# Patient Record
Sex: Female | Born: 2000 | Race: White | Hispanic: No | Marital: Single | Smoking: Never smoker
Health system: Southern US, Community
[De-identification: ages and names within clinical notes are randomized; demographics above are authoritative.]

## PROBLEM LIST (undated history)

## (undated) DIAGNOSIS — R011 Cardiac murmur, unspecified: Secondary | ICD-10-CM

## (undated) HISTORY — DX: Cardiac murmur, unspecified: R01.1

---
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
## 2018-03-06 ENCOUNTER — Telehealth: Payer: Self-pay | Admitting: Family Medicine

## 2018-03-06 NOTE — Telephone Encounter (Signed)
Patient was immune in 2018. Should she have the MMR?

## 2018-03-06 NOTE — Telephone Encounter (Signed)
See note

## 2018-03-06 NOTE — Telephone Encounter (Deleted)
Message is also in daughters chart.

## 2018-03-06 NOTE — Telephone Encounter (Signed)
No. Should be fine.

## 2018-03-06 NOTE — Telephone Encounter (Signed)
Copied from [place] [id number]. Topic: General - Other >> Mar 06, 2018  8:27 AM Lennox Solders wrote: Reason for CRM: pt mom ann is calling her daughter is at [employer] and they have mumps outbreak the school is offering vaccine.  Pt mom would like to know if her daughter should have a titer or just come to office for another vaccine. Mom would like to know what does CDC recommends

## 2018-03-06 NOTE — Telephone Encounter (Signed)
Left message for patients mother that per Dr. Juleen China patient does not need titer or injection.

## 2018-04-30 ENCOUNTER — Encounter: Payer: Self-pay | Admitting: Physician Assistant

## 2018-04-30 ENCOUNTER — Ambulatory Visit (INDEPENDENT_AMBULATORY_CARE_PROVIDER_SITE_OTHER): Payer: 59 | Admitting: Physician Assistant

## 2018-04-30 VITALS — BP 104/70 | HR 84 | Temp 99.0°F | Ht 62.01 in | Wt 108.8 lb

## 2018-04-30 DIAGNOSIS — R52 Pain, unspecified: Secondary | ICD-10-CM

## 2018-04-30 LAB — POCT INFLUENZA A/B
INFLUENZA A, POC: NEGATIVE
Influenza B, POC: NEGATIVE

## 2018-04-30 NOTE — Progress Notes (Signed)
Katie Estrada is a 17 y.o. female here for a new problem.  History of Present Illness:   Chief Complaint  Patient presents with  . Cough  . Generalized Body Aches  . Nasal Congestion    HPI   Over the weekend patient developed nasal congestion, generalized body aches, and dry cough.  She has not had any fever.  She did receive a flu shot this year.  She is eating and drinking well.  She currently lives at [facility] and has been around several people who are sick.  She is taking ibuprofen and this is improving her symptoms for the most part.  She denies chest pain, shortness of breath, neck stiffness, swollen jaws.  She had some sore throat at first but it is essentially resolved.  Past Medical History:  Diagnosis Date  . Heart murmur      Social History   Socioeconomic History  . Marital status: Single    Spouse name: Not on file  . Number of children: Not on file  . Years of education: Not on file  . Highest education level: Not on file  Occupational History  . Not on file  Social Needs  . Financial resource strain: Not on file  . Food insecurity:    Worry: Not on file    Inability: Not on file  . Transportation needs:    Medical: Not on file    Non-medical: Not on file  Tobacco Use  . Smoking status: Never Smoker  . Smokeless tobacco: Never Used  Substance and Sexual Activity  . Alcohol use: No  . Drug use: No  . Sexual activity: Not Currently  Lifestyle  . Physical activity:    Days per week: Not on file    Minutes per session: Not on file  . Stress: Not on file  Relationships  . Social connections:    Talks on phone: Not on file    Gets together: Not on file    Attends religious service: Not on file    Active member of club or organization: Not on file    Attends meetings of clubs or organizations: Not on file    Relationship status: Not on file  . Intimate partner violence:    Fear of current or ex partner: Not on file    Emotionally  abused: Not on file    Physically abused: Not on file    Forced sexual activity: Not on file  Other Topics Concern  . Not on file  Social History Narrative  . Not on file    History reviewed. No pertinent surgical history.  History reviewed. No pertinent family history.  No Known Allergies  Current Medications:   Current Outpatient Medications:  .  JUNEL 1/20 1-20 MG-MCG tablet, TAKE 1 TABLET BY MOUTH EVERY DAY, Disp: 84 tablet, Rfl: 0 .  magic mouthwash w/lidocaine SOLN, One part Benadryl, Maalox, Nystatin mixed with 1/2 lidocaine., Disp: 360 mL, Rfl: 1 .  Multiple Vitamins-Minerals (MULTI-VITAMIN GUMMIES PO), Take 1 each by mouth daily., Disp: , Rfl:  .  norethindrone-ethinyl estradiol (JUNEL 1/20) 1-20 MG-MCG tablet, TAKE 1 TABLET BY MOUTH EVERY DAY, Disp: , Rfl:    Review of Systems:   ROS Negative unless otherwise specified per HPI.  Vitals:   Vitals:   04/30/18 1617  BP: 104/70  Pulse: 84  Temp: 99 F (37.2 C)  TempSrc: Oral  SpO2: 99%  Weight: 108 lb 12.8 oz (49.4 kg)  Height: 5' 2.01" (1.575  m)     Body mass index is 19.89 kg/m.  Physical Exam:   Physical Exam  Constitutional: She appears well-developed. She is cooperative.  Non-toxic appearance. She does not have a sickly appearance. She does not appear ill. No distress.  HENT:  Head: Normocephalic and atraumatic.  Right Ear: Tympanic membrane, external ear and ear canal normal. Tympanic membrane is not erythematous, not retracted and not bulging.  Left Ear: Tympanic membrane, external ear and ear canal normal. Tympanic membrane is not erythematous, not retracted and not bulging.  Nose: Mucosal edema and rhinorrhea present. Right sinus exhibits no maxillary sinus tenderness and no frontal sinus tenderness. Left sinus exhibits no maxillary sinus tenderness and no frontal sinus tenderness.  Mouth/Throat: Uvula is midline. Posterior oropharyngeal erythema present. No posterior oropharyngeal edema. Tonsils  are 0 on the right. Tonsils are 0 on the left.  Eyes: Conjunctivae and lids are normal.  Neck: Trachea normal.  Cardiovascular: Normal rate, regular rhythm, S1 normal, S2 normal and normal heart sounds.  Pulmonary/Chest: Effort normal and breath sounds normal. She has no decreased breath sounds. She has no wheezes. She has no rhonchi. She has no rales.  Lymphadenopathy:    She has no cervical adenopathy.  Neurological: She is alert.  Skin: Skin is warm, dry and intact.  Psychiatric: She has a normal mood and affect. Her speech is normal and behavior is normal.  Nursing note and vitals reviewed.   Results for orders placed or performed in visit on 04/30/18  POCT Influenza A/B  Result Value Ref Range   Influenza A, POC Negative Negative   Influenza B, POC Negative Negative    Assessment and Plan:   Katie Estrada was seen today for cough, generalized body aches and nasal congestion.  Diagnoses and all orders for this visit:  Body aches -     POCT Influenza A/B   I suspect she has a viral illness.  Flu test was negative.  Discussed supportive care including ibuprofen.  Return precautions discussed and advised.  Patient and father verbalized understanding to plan.  . Reviewed expectations re: course of current medical issues. . Discussed self-management of symptoms. . Outlined signs and symptoms indicating need for more acute intervention. . Patient verbalized understanding and all questions were answered. . See orders for this visit as documented in the electronic medical record. . Patient received an After-Visit Summary.    Inda Coke, PA-C

## 2018-04-30 NOTE — Patient Instructions (Signed)
It was great to see you!  You have a viral upper respiratory infection. Antibiotics are not needed for this.  Viral infections usually take 7-10 days to resolve.  The cough can last a few weeks to go away.  Ibuprofen as needed.  Push fluids and get plenty of rest. Please return if you are not improving as expected, or if you have high fevers (>101.5) or difficulty swallowing or worsening productive cough.  Call clinic with questions.  I hope you start feeling better soon!

## 2018-05-01 ENCOUNTER — Encounter: Payer: Self-pay | Admitting: Physician Assistant

## 2018-05-18 ENCOUNTER — Other Ambulatory Visit: Payer: Self-pay | Admitting: Family Medicine

## 2018-05-18 DIAGNOSIS — N926 Irregular menstruation, unspecified: Secondary | ICD-10-CM

## 2018-06-05 ENCOUNTER — Ambulatory Visit (INDEPENDENT_AMBULATORY_CARE_PROVIDER_SITE_OTHER): Payer: 59 | Admitting: Physician Assistant

## 2018-06-05 ENCOUNTER — Encounter: Payer: Self-pay | Admitting: Physician Assistant

## 2018-06-05 VITALS — BP 100/68 | HR 95 | Temp 98.4°F | Ht 62.0 in | Wt 107.4 lb

## 2018-06-05 DIAGNOSIS — K1370 Unspecified lesions of oral mucosa: Secondary | ICD-10-CM | POA: Diagnosis not present

## 2018-06-05 LAB — CBC WITH DIFFERENTIAL/PLATELET
Basophils Absolute: 0 10*3/uL (ref 0.0–0.1)
Basophils Relative: 1 % (ref 0.0–3.0)
Eosinophils Absolute: 0 10*3/uL (ref 0.0–0.7)
Eosinophils Relative: 1 % (ref 0.0–5.0)
HCT: 37.2 % (ref 36.0–49.0)
Hemoglobin: 12.4 g/dL (ref 12.0–16.0)
LYMPHS ABS: 1.4 10*3/uL (ref 0.7–4.0)
Lymphocytes Relative: 28.7 % (ref 24.0–48.0)
MCHC: 33.4 g/dL (ref 31.0–37.0)
MCV: 79.7 fl (ref 78.0–98.0)
Monocytes Absolute: 0.2 10*3/uL (ref 0.1–1.0)
Monocytes Relative: 5 % (ref 3.0–12.0)
Neutro Abs: 3.1 10*3/uL (ref 1.4–7.7)
Neutrophils Relative %: 64.3 % (ref 43.0–71.0)
Platelets: 361 10*3/uL (ref 150.0–575.0)
RBC: 4.66 Mil/uL (ref 3.80–5.70)
RDW: 12.7 % (ref 11.4–15.5)
WBC: 4.8 10*3/uL (ref 4.5–13.5)

## 2018-06-05 LAB — C-REACTIVE PROTEIN: CRP: 0.6 mg/dL (ref 0.5–20.0)

## 2018-06-05 LAB — VITAMIN B12: Vitamin B-12: 190 pg/mL — ABNORMAL LOW (ref 211–911)

## 2018-06-05 LAB — SEDIMENTATION RATE: Sed Rate: 20 mm/hr (ref 0–20)

## 2018-06-05 MED ORDER — MAGIC MOUTHWASH: 5.0000 mL | Freq: Three times a day (TID) | ORAL | 0 refills | Status: DC | PRN

## 2018-06-05 NOTE — Patient Instructions (Addendum)
It was great to see you!  We will contact you with your lab results.  Use mouthwash as prescribed. Please follow-up as needed.  Take care,  Inda Coke PA-C

## 2018-06-05 NOTE — Progress Notes (Signed)
Katie Estrada is a 18 y.o. female here for a new problem.  I acted as a Education administrator for [employer], PA-C Katie Pickler, LPN  History of Present Illness:   Chief Complaint  Patient presents with  . Mouth Lesions    Mouth Lesions   The current episode started more than 1 week ago (Pt c/o 2- 3 mouth lesion now, did have 4-5 to start.). The onset was sudden. The problem occurs frequently. The problem has been gradually worsening. The problem is severe. Nothing (Pt has taken Ibuprofen, Magic Mouthwash, Chorahexidine) relieves the symptoms. The symptoms are aggravated by eating and drinking. Associated symptoms include mouth sores and sore throat (due to mouth lesion). Pertinent negatives include no fever, no eye itching, no abdominal pain, no constipation, no diarrhea, no nausea, no vomiting, no congestion, no ear discharge, no ear pain, no headaches, no swollen glands, no muscle aches, no neck pain, no neck stiffness, no rash, no diaper rash, no eye discharge and no eye pain. There were no sick contacts.   She has had multiple episodes of canker sores throughout her life.  She went to the dentist yesterday and was told it was not related to her teeth/gums.   Denies: fatigue, unusual rashes, joint pain, weight changes, fevers  Past Medical History:  Diagnosis Date  . Heart murmur      Social History   Socioeconomic History  . Marital status: Single    Spouse name: Not on file  . Number of children: Not on file  . Years of education: Not on file  . Highest education level: Not on file  Occupational History  . Not on file  Social Needs  . Financial resource strain: Not on file  . Food insecurity:    Worry: Not on file    Inability: Not on file  . Transportation needs:    Medical: Not on file    Non-medical: Not on file  Tobacco Use  . Smoking status: Never Smoker  . Smokeless tobacco: Never Used  Substance and Sexual Activity  . Alcohol use: No  . Drug use: No  . Sexual  activity: Not Currently  Lifestyle  . Physical activity:    Days per week: Not on file    Minutes per session: Not on file  . Stress: Not on file  Relationships  . Social connections:    Talks on phone: Not on file    Gets together: Not on file    Attends religious service: Not on file    Active member of club or organization: Not on file    Attends meetings of clubs or organizations: Not on file    Relationship status: Not on file  . Intimate partner violence:    Fear of current or ex partner: Not on file    Emotionally abused: Not on file    Physically abused: Not on file    Forced sexual activity: Not on file  Other Topics Concern  . Not on file  Social History Narrative  . Not on file    History reviewed. No pertinent surgical history.  History reviewed. No pertinent family history.  No Known Allergies  Current Medications:   Current Outpatient Medications:  .  JUNEL 1/20 1-20 MG-MCG tablet, TAKE 1 TABLET BY MOUTH EVERY DAY, Disp: 84 tablet, Rfl: 3 .  magic mouthwash w/lidocaine SOLN, One part Benadryl, Maalox, Nystatin mixed with 1/2 lidocaine., Disp: 360 mL, Rfl: 1 .  Multiple Vitamins-Minerals (MULTI-VITAMIN GUMMIES PO), Take 1  each by mouth daily., Disp: , Rfl:  .  norethindrone-ethinyl estradiol (JUNEL 1/20) 1-20 MG-MCG tablet, TAKE 1 TABLET BY MOUTH EVERY DAY, Disp: , Rfl:  .  magic mouthwash SOLN, Take 5 mLs by mouth 3 (three) times daily as needed for mouth pain., Disp: 240 mL, Rfl: 0   Review of Systems:   Review of Systems  Constitutional: Negative for fever.  HENT: Positive for mouth sores and sore throat (due to mouth lesion). Negative for congestion, ear discharge and ear pain.   Eyes: Negative for pain, discharge and itching.  Gastrointestinal: Negative for abdominal pain, constipation, diarrhea, nausea and vomiting.  Musculoskeletal: Negative for neck pain.  Skin: Negative for rash.  Neurological: Negative for headaches.    Vitals:   Vitals:    06/05/18 1129  BP: 100/68  Pulse: 95  Temp: 98.4 F (36.9 C)  TempSrc: Oral  SpO2: 96%  Weight: 107 lb 6.1 oz (48.7 kg)  Height: 5\' 2"  (1.575 m)     Body mass index is 19.64 kg/m.  Physical Exam:   Physical Exam Vitals signs and nursing note reviewed.  Constitutional:      General: She is not in acute distress.    Appearance: She is well-developed. She is not ill-appearing or toxic-appearing.  HENT:     Mouth/Throat:     Mouth: Oral lesions (two erythematous circular lesions to inside of bottom lower lip; no drainage noted) present.     Dentition: Normal dentition. No gingival swelling or gum lesions.  Cardiovascular:     Rate and Rhythm: Normal rate and regular rhythm.     Pulses: Normal pulses.     Heart sounds: Normal heart sounds, S1 normal and S2 normal.     Comments: No LE edema Pulmonary:     Effort: Pulmonary effort is normal.     Breath sounds: Normal breath sounds.  Skin:    General: Skin is warm and dry.  Neurological:     Mental Status: She is alert.     GCS: GCS eye subscore is 4. GCS verbal subscore is 5. GCS motor subscore is 6.  Psychiatric:        Speech: Speech normal.        Behavior: Behavior normal. Behavior is cooperative.      Assessment and Plan:   Katie Estrada was seen today for mouth lesions.  Diagnoses and all orders for this visit:  Mouth lesion No red flags on exam or determined during history. Patient requesting work-up for recurrent ulcers. Discussed labs listed below. I have also prescribed the mouthwash that she has requested, with Hydrocortisone 60 mg + Nystatin 30 ml + qs Benadryl = 240 ml. Further intervention based on lab results. -     RPR -     HIV Antibody (routine testing w rflx) -     CBC with Differential/Platelet -     Sedimentation rate -     C-reactive protein -     ANA -     Vitamin B12  Other orders -     magic mouthwash SOLN; Take 5 mLs by mouth 3 (three) times daily as needed for mouth pain.    . Reviewed  expectations re: course of current medical issues. . Discussed self-management of symptoms. . Outlined signs and symptoms indicating need for more acute intervention. . Patient verbalized understanding and all questions were answered. . See orders for this visit as documented in the electronic medical record. . Patient received an After-Visit Summary.  CMA or LPN served as scribe during this visit. History, Physical, and Plan performed by medical provider. The above documentation has been reviewed and is accurate and complete.   Inda Coke, PA-C

## 2018-06-07 LAB — RPR: RPR Ser Ql: NONREACTIVE

## 2018-06-07 LAB — HIV ANTIBODY (ROUTINE TESTING W REFLEX): HIV 1&2 Ab, 4th Generation: NONREACTIVE

## 2018-06-07 LAB — ANA: Anti Nuclear Antibody(ANA): NEGATIVE

## 2018-06-10 ENCOUNTER — Telehealth: Payer: Self-pay | Admitting: Family Medicine

## 2018-06-10 ENCOUNTER — Other Ambulatory Visit: Payer: Self-pay

## 2018-06-10 MED ORDER — "SYRINGE 25G X 1"" 3 ML MISC": 1.0000 "application " | 0 refills | Status: DC

## 2018-06-10 MED ORDER — CYANOCOBALAMIN 1000 MCG/ML IJ SOLN: INTRAMUSCULAR | 0 refills | Status: DC

## 2018-06-10 NOTE — Telephone Encounter (Signed)
See note  Copied from [place] [id number]. Topic: General - Other >> Jun 10, 2018  9:18 AM Katie Estrada wrote: Reason for CRM: Patients mom Katie Estrada called to ask can the Rx for B-12 be sent to the Pharmacy so that she can give daughter the shot at home. She states that patient has Estrada very hectic school schedule and its hard to come up for this injection. Ask for instructions be sent to the pharmacy.  Mom is Estrada Marine scientist at [employer]. Ph# [phone]

## 2018-06-13 NOTE — Telephone Encounter (Signed)
Called in scripts l/m to let mom know.

## 2018-06-17 ENCOUNTER — Other Ambulatory Visit: Payer: Self-pay | Admitting: *Deleted

## 2018-06-17 MED ORDER — MAGIC MOUTHWASH: 5.0000 mL | Freq: Three times a day (TID) | ORAL | 0 refills | Status: DC | PRN

## 2018-06-17 NOTE — Telephone Encounter (Signed)
Rx for Magic Mouthwash faxed to [facility].

## 2018-06-27 ENCOUNTER — Telehealth: Payer: Self-pay | Admitting: Family Medicine

## 2018-06-27 NOTE — Telephone Encounter (Signed)
See note

## 2018-06-27 NOTE — Telephone Encounter (Signed)
Is this something you can help with or do I need to send to [facility]?

## 2018-06-27 NOTE — Telephone Encounter (Signed)
Since they have significantly worsened, I think she needs to be re-evaluated in the office so we can do a good exam and see what's going on.

## 2018-06-27 NOTE — Telephone Encounter (Signed)
App made for office in the morning

## 2018-06-27 NOTE — Telephone Encounter (Signed)
Copied from [place] [id number]. Topic: Quick Communication - Rx Refill/Question >> Jun 27, 2018  8:12 AM Scherrie Gerlach wrote: Medication: something for sores in her mouth  Mom calling to ask if there is anything Dr Juleen China can do in addition to the B12 inj that can help with the sores in her mouth. But they seem to be getting worse instead of better. Mom describes as "raging sores".  Pt unable to eat due to the sores and losing weight. This has been going on for years.  Pt saw the dentist, who said see her doctor.  Pt saw Aldona Bar 1/08 for this same issues. Mom wanting to see if you can do anti viral now as you had mentioned.  Please advise [id number] [address], [place] [phone] (Phone) [phone] (Fax)

## 2018-06-27 NOTE — Progress Notes (Signed)
Katie Estrada is a 18 y.o. female here for a follow up of a pre-existing problem.  I acted as a Education administrator for [employer], PA-C Anselmo Pickler, LPN  History of Present Illness:   Chief Complaint  Patient presents with  . Mouth Lesions    Mouth Lesions   Episode onset: Pt here for recurrent mouth lesions, has gotten worse. The onset was sudden. The problem occurs continuously. The problem has been gradually worsening. The problem is moderate. The symptoms are relieved by one or more prescription drugs. The symptoms are aggravated by eating and drinking. Associated symptoms include mouth sores. Pertinent negatives include no fever and no cough.   She has tried: lysine, magic mouthwash with lidocaine and magic mouthwash with hydrocortisone. She is continuing weekly B-12 injections, as it was discovered at her last visit that her B12 was 190.  Denies vaginal ulcers.   Past Medical History:  Diagnosis Date  . Heart murmur      Social History   Socioeconomic History  . Marital status: Single    Spouse name: Not on file  . Number of children: Not on file  . Years of education: Not on file  . Highest education level: Not on file  Occupational History  . Not on file  Social Needs  . Financial resource strain: Not on file  . Food insecurity:    Worry: Not on file    Inability: Not on file  . Transportation needs:    Medical: Not on file    Non-medical: Not on file  Tobacco Use  . Smoking status: Never Smoker  . Smokeless tobacco: Never Used  Substance and Sexual Activity  . Alcohol use: No  . Drug use: No  . Sexual activity: Not Currently  Lifestyle  . Physical activity:    Days per week: Not on file    Minutes per session: Not on file  . Stress: Not on file  Relationships  . Social connections:    Talks on phone: Not on file    Gets together: Not on file    Attends religious service: Not on file    Active member of club or organization: Not on file    Attends  meetings of clubs or organizations: Not on file    Relationship status: Not on file  . Intimate partner violence:    Fear of current or ex partner: Not on file    Emotionally abused: Not on file    Physically abused: Not on file    Forced sexual activity: Not on file  Other Topics Concern  . Not on file  Social History Narrative  . Not on file    History reviewed. No pertinent surgical history.  History reviewed. No pertinent family history.  No Known Allergies  Current Medications:   Current Outpatient Medications:  .  cyanocobalamin (,VITAMIN B-12,) 1000 MCG/ML injection, 1000 mcg injection once per month., Disp: 6 mL, Rfl: 0 .  JUNEL 1/20 1-20 MG-MCG tablet, TAKE 1 TABLET BY MOUTH EVERY DAY, Disp: 84 tablet, Rfl: 3 .  Lysine 500 MG TABS, Take 2 tablets by mouth daily. , Disp: , Rfl:  .  magic mouthwash w/lidocaine SOLN, One part Benadryl, Maalox, Nystatin mixed with 1/2 lidocaine., Disp: 360 mL, Rfl: 1 .  Multiple Vitamins-Minerals (MULTI-VITAMIN GUMMIES PO), Take 1 each by mouth daily., Disp: , Rfl:  .  Probiotic Product (PROBIOTIC DAILY PO), Take 1 tablet by mouth daily., Disp: , Rfl:  .  Syringe/Needle, Disp, (SYRINGE  3CC/25GX1") 25G X 1" 3 ML MISC, 1 application by Does not apply route once a week., Disp: 12 each, Rfl: 0 .  predniSONE (DELTASONE) 20 MG tablet, Take 1 tablet (20 mg total) by mouth daily with breakfast., Disp: 5 tablet, Rfl: 0 .  triamcinolone (KENALOG) 0.1 % paste, Use as directed 1 application in the mouth or throat 2 (two) times daily., Disp: 5 g, Rfl: 12   Review of Systems:   Review of Systems  Constitutional: Negative for fever.  HENT: Positive for mouth sores.   Respiratory: Negative for cough.     Vitals:   Vitals:   06/28/18 0755  BP: 110/70  Pulse: 76  Temp: 98.7 F (37.1 C)  TempSrc: Oral  SpO2: 98%  Weight: 110 lb 4 oz (50 kg)  Height: 5\' 2"  (1.575 m)     Body mass index is 20.16 kg/m.  Physical Exam:   Physical Exam Vitals  signs and nursing note reviewed.  Constitutional:      General: She is not in acute distress.    Appearance: She is well-developed. She is not ill-appearing or toxic-appearing.  HENT:     Head: Normocephalic and atraumatic.     Right Ear: Tympanic membrane, ear canal and external ear normal. Tympanic membrane is not erythematous, retracted or bulging.     Left Ear: Tympanic membrane, ear canal and external ear normal. Tympanic membrane is not erythematous, retracted or bulging.     Nose: Nose normal.     Right Sinus: No maxillary sinus tenderness or frontal sinus tenderness.     Left Sinus: No maxillary sinus tenderness or frontal sinus tenderness.     Mouth/Throat:     Mouth: Oral lesions (two well-circumscribed erythematous areas in lower inner lip and on R inner lower cheeks) present.     Pharynx: Uvula midline. No posterior oropharyngeal erythema.  Eyes:     General: Lids are normal.     Conjunctiva/sclera: Conjunctivae normal.  Neck:     Trachea: Trachea normal.  Cardiovascular:     Rate and Rhythm: Normal rate and regular rhythm.     Heart sounds: Normal heart sounds, S1 normal and S2 normal.  Pulmonary:     Effort: Pulmonary effort is normal.     Breath sounds: Normal breath sounds. No decreased breath sounds, wheezing, rhonchi or rales.  Lymphadenopathy:     Cervical: No cervical adenopathy.  Skin:    General: Skin is warm and dry.  Neurological:     Mental Status: She is alert.  Psychiatric:        Speech: Speech normal.        Behavior: Behavior normal. Behavior is cooperative.      Assessment and Plan:   Tamaiya was seen today for mouth lesions.  Diagnoses and all orders for this visit:  Recurrent aphthous ulcer Uncontrolled. Continue Vit B12 injections. Triamcinolone paste rx provided. Recommended patient start with this and if no improvement, may trial 20 mg oral prednisone x 5 days. I have already sent this in for her. If persists, consider specialist  follow-up.  Other orders -     triamcinolone (KENALOG) 0.1 % paste; Use as directed 1 application in the mouth or throat 2 (two) times daily. -     predniSONE (DELTASONE) 20 MG tablet; Take 1 tablet (20 mg total) by mouth daily with breakfast.   . Reviewed expectations re: course of current medical issues. . Discussed self-management of symptoms. . Outlined signs and symptoms indicating need  for more acute intervention. . Patient verbalized understanding and all questions were answered. . See orders for this visit as documented in the electronic medical record. . Patient received an After-Visit Summary.  CMA or LPN served as scribe during this visit. History, Physical, and Plan performed by medical provider. The above documentation has been reviewed and is accurate and complete.   Inda Coke, PA-C

## 2018-06-28 ENCOUNTER — Encounter: Payer: Self-pay | Admitting: Physician Assistant

## 2018-06-28 ENCOUNTER — Ambulatory Visit (INDEPENDENT_AMBULATORY_CARE_PROVIDER_SITE_OTHER): Payer: 59 | Admitting: Physician Assistant

## 2018-06-28 ENCOUNTER — Ambulatory Visit: Payer: 59 | Admitting: Physician Assistant

## 2018-06-28 VITALS — BP 110/70 | HR 76 | Temp 98.7°F | Ht 62.0 in | Wt 110.2 lb

## 2018-06-28 DIAGNOSIS — K120 Recurrent oral aphthae: Secondary | ICD-10-CM | POA: Diagnosis not present

## 2018-06-28 DIAGNOSIS — E538 Deficiency of other specified B group vitamins: Secondary | ICD-10-CM | POA: Insufficient documentation

## 2018-06-28 MED ORDER — TRIAMCINOLONE ACETONIDE 0.1 % MT PSTE: 1.0000 "application " | PASTE | Freq: Two times a day (BID) | OROMUCOSAL | 12 refills | Status: DC

## 2018-06-28 MED ORDER — PREDNISONE 20 MG PO TABS: 20.0000 mg | ORAL_TABLET | Freq: Every day | ORAL | 0 refills | Status: DC

## 2018-06-28 NOTE — Assessment & Plan Note (Signed)
Uncontrolled. Continue Vit B12 injections. Triamcinolone paste rx provided. Recommended patient start with this and if no improvement, may trial 20 mg oral prednisone x 5 days. I have already sent this in for her. If persists, consider specialist follow-up.

## 2018-06-28 NOTE — Patient Instructions (Signed)
It was great to see you!  Start with the triamcinolone paste.  Also, studies have shown for recurrent aphthous ulcers, oral prednisone given at the dose of 20 to 40 mg per day for four to seven days will often provide relief from discomfort and accelerate healing of aphthous ulcers.   Take care,  Inda Coke PA-C

## 2018-07-04 ENCOUNTER — Encounter: Payer: Self-pay | Admitting: Family Medicine

## 2018-07-04 ENCOUNTER — Ambulatory Visit (INDEPENDENT_AMBULATORY_CARE_PROVIDER_SITE_OTHER): Payer: 59 | Admitting: Family Medicine

## 2018-07-04 VITALS — BP 98/66 | HR 101 | Temp 98.1°F | Ht 62.0 in | Wt 108.0 lb

## 2018-07-04 DIAGNOSIS — H6121 Impacted cerumen, right ear: Secondary | ICD-10-CM

## 2018-07-04 DIAGNOSIS — B349 Viral infection, unspecified: Secondary | ICD-10-CM | POA: Diagnosis not present

## 2018-07-04 MED ORDER — BENZONATATE 100 MG PO CAPS: 100.0000 mg | ORAL_CAPSULE | Freq: Three times a day (TID) | ORAL | 0 refills | Status: DC | PRN

## 2018-07-04 NOTE — Patient Instructions (Signed)
-  robitussin DM during day or your bromfed.  -1 tablespoon honey/day -cool mist humidifier at night -flonase at night -nyquil at night to help sleep -can try tessalon pearls. I sent those in.   Let us know if shortness of breath, fever or worsening symptoms. But symptoms may linger for 7-10 days.

## 2018-07-04 NOTE — Progress Notes (Signed)
Patient: Katie Estrada MRN: [id number] DOB: 2000-09-05 PCP: Briscoe Deutscher, DO     Subjective:  Chief Complaint  Patient presents with  . Cough  . Sore Throat    HPI: The patient is a 18 y.o. female who presents today for cough and sore throat. She tested positive for the flu on Monday. She did 3 doses of tamiflu and had nausea and headache so she stopped taking this. She states her cough is dry in nature. She has been taking bromfed and motrin and tylenol. She has not had fever since Tuesday. She thinks her suite mate had the flu, but she never said anything. No shortness of breath or wheezing. No hx of asthma and doesn't smoke. She doesn't really have bad congestion.  She has mild ear pain. Overall she has had improvement in her symptoms.    Review of Systems  Constitutional: Positive for fatigue. Negative for chills and fever.  HENT: Positive for congestion, ear pain, rhinorrhea and sore throat. Negative for postnasal drip, sinus pressure and sinus pain.        C/o minimal pressure in b/l ears  Eyes: Negative for photophobia and pain.  Respiratory: Positive for cough. Negative for shortness of breath.   Cardiovascular: Negative for chest pain.  Gastrointestinal: Positive for abdominal pain. Negative for diarrhea and nausea.  Musculoskeletal: Negative for back pain and neck pain.  Neurological: Positive for headaches. Negative for dizziness.  Psychiatric/Behavioral: Positive for sleep disturbance.    Allergies Patient has No Known Allergies.  Past Medical History Patient  has a past medical history of Heart murmur.  Surgical History Patient  has no past surgical history on file.  Family History Pateint's family history is not on file.  Social History Patient  reports that she has never smoked. She has never used smokeless tobacco. She reports that she does not drink alcohol or use drugs.    Objective: Vitals:   07/04/18 1342  BP: 98/66  Pulse: (!) 101  Temp: 98.1  F (36.7 C)  TempSrc: Oral  SpO2: 95%  Weight: 108 lb (49 kg)  Height: 5\' 2"  (1.575 m)    Body mass index is 19.75 kg/m.  Physical Exam Vitals signs reviewed.  Constitutional:      Appearance: She is well-developed.  HENT:     Head: Normocephalic and atraumatic.     Right Ear: Ear canal normal.     Left Ear: Tympanic membrane and ear canal normal.     Ears:     Comments: Right TM occluded by cerumen     Nose: Congestion present.     Comments: No TTP over sinuses Nasal turbinates wnl     Mouth/Throat:     Mouth: Mucous membranes are moist.     Pharynx: No oropharyngeal exudate or posterior oropharyngeal erythema.     Tonsils: No tonsillar exudate.  Eyes:     Conjunctiva/sclera: Conjunctivae normal.     Pupils: Pupils are equal, round, and reactive to light.  Neck:     Musculoskeletal: Normal range of motion and neck supple.  Cardiovascular:     Rate and Rhythm: Normal rate and regular rhythm.     Heart sounds: Normal heart sounds.  Pulmonary:     Effort: Pulmonary effort is normal. No respiratory distress.     Breath sounds: Normal breath sounds. No wheezing or rales.  Abdominal:     General: Bowel sounds are normal. There is no distension.     Palpations: Abdomen is soft.  Tenderness: There is no abdominal tenderness. There is no rebound.  Lymphadenopathy:     Cervical: No cervical adenopathy.  Skin:    General: Skin is warm.     Capillary Refill: Capillary refill takes less than 2 seconds.  Neurological:     General: No focal deficit present.     Mental Status: She is alert and oriented to person, place, and time.    Ceruminosis is noted.  Verbal consent given for lavage. Wax is removed by syringing and manual debridement. Instructions for home care to prevent wax buildup are given. Tm visualized and wnl after lavage.      Assessment/plan: 1. Viral illness Just diagnosed with the flu. Likely still part of flu illness. She is getting better, continues with  lingering cough. Continue with conservative therapy with rest, fluids, ibuprofen prn, robitussin DM, nyquil at night and honey. Also recommended flonase at night and cool mist humidifier. Will also send in tessalon pearls. Precautions given for any worsening symptoms e.g. shortness of breath, fever, etc. To return. Discussed flu symptoms, like all viral illness can normally last 5-10 days.     Return if symptoms worsen or fail to improve.  Orma Flaming, MD [place]   07/04/2018

## 2018-10-24 ENCOUNTER — Encounter: Payer: Self-pay | Admitting: Family Medicine

## 2018-11-25 ENCOUNTER — Telehealth: Payer: Self-pay | Admitting: Physical Therapy

## 2018-11-25 ENCOUNTER — Encounter: Payer: Self-pay | Admitting: Family Medicine

## 2018-11-25 NOTE — Telephone Encounter (Signed)
Copied from [id number] [id number]. Topic: Quick Communication - See Telephone Encounter >> Nov 25, 2018  4:41 PM Loma [place] wrote: CRM for notification. See Telephone encounter for: 11/25/18.Dr. Juleen China... in regards to my previous My Chart message.. I just found out that I also need my Immunization forms filled out along with a QuantiFERON lab test done. I believe I had my Hep B titer at your office previously. Is it okay if I come in soon for the lab and I will attach my forms. Please let me know if I need any more immunizations. 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 Thank you so much!  Attachments   Pt found out must have on July 1 and is very anxious 2nd call

## 2018-11-25 NOTE — Telephone Encounter (Signed)
Patient called and would like to know if her school form is ready for her to pickup since she needs its before July 1st. Please call patient back, thanks.

## 2018-11-26 ENCOUNTER — Other Ambulatory Visit (INDEPENDENT_AMBULATORY_CARE_PROVIDER_SITE_OTHER): Payer: 59

## 2018-11-26 ENCOUNTER — Other Ambulatory Visit: Payer: Self-pay

## 2018-11-26 DIAGNOSIS — Z111 Encounter for screening for respiratory tuberculosis: Secondary | ICD-10-CM

## 2018-11-26 DIAGNOSIS — Z1159 Encounter for screening for other viral diseases: Secondary | ICD-10-CM

## 2018-11-26 DIAGNOSIS — Z139 Encounter for screening, unspecified: Secondary | ICD-10-CM

## 2018-11-26 NOTE — Addendum Note (Signed)
Addended by: Francis Dowse T on: 11/26/2018 02:31 PM   Modules accepted: Orders

## 2018-11-26 NOTE — Telephone Encounter (Signed)
Called and spoke to patient.

## 2018-11-26 NOTE — Telephone Encounter (Signed)
ppw pending lab work

## 2018-11-27 ENCOUNTER — Encounter: Payer: Self-pay | Admitting: Family Medicine

## 2018-11-27 ENCOUNTER — Ambulatory Visit (INDEPENDENT_AMBULATORY_CARE_PROVIDER_SITE_OTHER): Payer: 59 | Admitting: Family Medicine

## 2018-11-27 ENCOUNTER — Other Ambulatory Visit: Payer: Self-pay

## 2018-11-27 VITALS — BP 98/62 | HR 96 | Temp 98.8°F | Ht 62.0 in | Wt 110.0 lb

## 2018-11-27 DIAGNOSIS — E538 Deficiency of other specified B group vitamins: Secondary | ICD-10-CM | POA: Diagnosis not present

## 2018-11-27 DIAGNOSIS — R5383 Other fatigue: Secondary | ICD-10-CM

## 2018-11-27 DIAGNOSIS — K120 Recurrent oral aphthae: Secondary | ICD-10-CM | POA: Diagnosis not present

## 2018-11-27 LAB — VITAMIN B12: Vitamin B-12: 236 pg/mL (ref 211–911)

## 2018-11-27 MED ORDER — CYANOCOBALAMIN 1000 MCG/ML IJ SOLN
1000.0000 ug | Freq: Once | INTRAMUSCULAR | Status: AC
  Administered 2018-11-27: 11:00:00 1000 ug via INTRAMUSCULAR

## 2018-11-27 NOTE — Progress Notes (Signed)
Katie Estrada is a 18 y.o. female is here for follow up.  History of Present Illness:   HPI:  Recent Dx of B12 deficiency. Took weekly injections x 4 weeks, then monthly x 2 months. Finished in April. Felt more energized until she finished.   Oral ulcers continue but go away more quickly with paste and using Biotene toothpaste.    There are no preventive care reminders to display for this patient.   Depression screen [facility] 2/9 11/27/2018 02/08/2017  Decreased Interest 0 0  Down, Depressed, Hopeless 0 0  PHQ - 2 Score 0 0   PMHx, SurgHx, SocialHx, FamHx, Medications, and Allergies were reviewed in the Visit Navigator and updated as appropriate.   Patient Active Problem List   Diagnosis Date Noted  . Recurrent aphthous ulcer 06/28/2018  . Vitamin B12 deficiency 06/28/2018  . Family history of ventricular fibrillation 06/08/2014   Social History   Tobacco Use  . Smoking status: Never Smoker  . Smokeless tobacco: Never Used  Substance Use Topics  . Alcohol use: No  . Drug use: No   Current Medications and Allergies   .  cyanocobalamin (,VITAMIN B-12,) 1000 MCG/ML injection, 1000 mcg injection once per month., Disp: 6 mL, Rfl: 0 .  JUNEL 1/20 1-20 MG-MCG tablet, TAKE 1 TABLET BY MOUTH EVERY DAY, Disp: 84 tablet, Rfl: 3 .  Lysine 500 MG TABS, Take 2 tablets by mouth daily. , Disp: , Rfl:  .  Probiotic Product (PROBIOTIC DAILY PO), Take 1 tablet by mouth daily., Disp: , Rfl:  .  Syringe/Needle, Disp, (SYRINGE 3CC/25GX1") 25G X 1" 3 ML MISC, 1 application by Does not apply route once a week., Disp: 12 each, Rfl: 0 .  triamcinolone (KENALOG) 0.1 % paste, Use as directed 1 application in the mouth or throat 2 (two) times daily., Disp: 5 g, Rfl: 12  No Known Allergies   Review of Systems   Pertinent items are noted in the HPI. Otherwise, a complete ROS is negative.  Vitals   Vitals:   11/27/18 1045  BP: 98/62  Pulse: 96  Temp: 98.8 F (37.1 C)  TempSrc: Oral  SpO2: 98%    Weight: 110 lb (49.9 kg)  Height: 5\' 2"  (1.575 m)     Body mass index is 20.12 kg/m.  Physical Exam   Physical Exam Vitals signs and nursing note reviewed.  HENT:     Head: Normocephalic and atraumatic.  Eyes:     Pupils: Pupils are equal, round, and reactive to light.  Neck:     Musculoskeletal: Normal range of motion and neck supple.  Cardiovascular:     Rate and Rhythm: Normal rate and regular rhythm.     Heart sounds: Normal heart sounds.  Pulmonary:     Effort: Pulmonary effort is normal.  Abdominal:     Palpations: Abdomen is soft.  Skin:    General: Skin is warm.  Psychiatric:        Behavior: Behavior normal.    Assessment and Plan   Katie Estrada was seen today for follow-up.  Diagnoses and all orders for this visit:  B12 deficiency -     Vitamin B12 -     cyanocobalamin ((VITAMIN B-12)) injection 1,000 mcg  Recurrent aphthous ulcer  Fatigue, unspecified type -     Gliadin antibodies, serum -     Tissue transglutaminase, IgA -     Reticulin Antibody, IgA w reflex titer -     Iron, TIBC and Ferritin Panel   .  Orders and follow up as documented in [place], reviewed diet, exercise and weight control, cardiovascular risk and specific lipid/LDL goals reviewed, reviewed medications and side effects in detail.  . Reviewed expectations re: course of current medical issues. . Outlined signs and symptoms indicating need for more acute intervention. . Patient verbalized understanding and all questions were answered. . Patient received an After Visit Summary.  Briscoe Deutscher, DO [place], [facility] 12/01/2018

## 2018-11-27 NOTE — Patient Instructions (Signed)
There are no preventive care reminders to display for this patient.  Depression screen [facility] 2/9 11/27/2018 02/08/2017  Decreased Interest 0 0  Down, Depressed, Hopeless 0 0  PHQ - 2 Score 0 0

## 2018-11-28 LAB — VARICELLA ZOSTER ANTIBODY, IGG: Varicella IgG: 1345 index

## 2018-11-28 LAB — QUANTIFERON-TB GOLD PLUS
Mitogen-NIL: >10 IU/mL
NIL: 0.01 IU/mL
QuantiFERON-TB Gold Plus: NEGATIVE
TB1-NIL: 0.01 IU/mL
TB2-NIL: 0 IU/mL

## 2018-11-28 LAB — HEPATITIS B CORE ANTIBODY, TOTAL: Hep B Core Total Ab: NONREACTIVE

## 2018-11-30 LAB — IRON,TIBC AND FERRITIN PANEL
%SAT: 24 % (calc) (ref 15–45)
Ferritin: 17 ng/mL (ref 16–154)
Iron: 89 ug/dL (ref 27–164)
TIBC: 367 mcg/dL (calc) (ref 271–448)

## 2018-11-30 LAB — GLIADIN ANTIBODIES, SERUM
Gliadin IgA: 5 Units
Gliadin IgG: 4 Units

## 2018-11-30 LAB — RETICULIN ANTIBODIES, IGA W TITER: Reticulin IGA Screen: NEGATIVE

## 2018-11-30 LAB — TISSUE TRANSGLUTAMINASE, IGA: (tTG) Ab, IgA: 1 U/mL

## 2018-12-02 ENCOUNTER — Telehealth: Payer: Self-pay | Admitting: Family Medicine

## 2018-12-02 ENCOUNTER — Ambulatory Visit: Payer: 59 | Admitting: Family Medicine

## 2018-12-02 MED ORDER — "SYRINGE 25G X 1"" 3 ML MISC": 1.0000 "application " | 0 refills | Status: DC

## 2018-12-02 MED ORDER — CYANOCOBALAMIN 1000 MCG/ML IJ SOLN: INTRAMUSCULAR | 0 refills | Status: DC

## 2018-12-02 NOTE — Telephone Encounter (Signed)
See lab notes for documentation

## 2018-12-02 NOTE — Telephone Encounter (Signed)
See note  Copied from [place] [phone]. Topic: General - Inquiry >> Nov 29, 2018  2:56 PM Mathis Bud wrote: Reason for CRM: Patient called to ask about her B12 shot.  Would like nurse or pcp to call back Call back # [phone]

## 2018-12-02 NOTE — Telephone Encounter (Signed)
See note  Copied from [place] [phone]. Topic: General - Inquiry >> Dec 02, 2018 12:17 PM Selinda Flavin B, NT wrote: Reason for CRM: Patient calling and states that she has some questions regarding the lab results she just got back. Please advise.  CB#: [phone]

## 2018-12-02 NOTE — Addendum Note (Signed)
Addended by: Francella Solian on: 12/02/2018 01:42 PM   Modules accepted: Orders

## 2018-12-03 ENCOUNTER — Encounter: Payer: Self-pay | Admitting: Family Medicine

## 2018-12-03 NOTE — Telephone Encounter (Signed)
Mom called for pt this am, as the pt is very frustrated that she cannot get answers OR her forms for school. 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 Mom states the office has them, and she is not sure if the hep b question of whether pt needs or not is holding this up. But requesting please call and let her know something asap.

## 2018-12-03 NOTE — Telephone Encounter (Signed)
See open message

## 2018-12-03 NOTE — Telephone Encounter (Signed)
Forms completed mom called copy left at reception

## 2018-12-04 ENCOUNTER — Other Ambulatory Visit: Payer: Self-pay

## 2018-12-04 ENCOUNTER — Ambulatory Visit (INDEPENDENT_AMBULATORY_CARE_PROVIDER_SITE_OTHER): Payer: 59

## 2018-12-04 DIAGNOSIS — Z23 Encounter for immunization: Secondary | ICD-10-CM

## 2018-12-04 DIAGNOSIS — Z1159 Encounter for screening for other viral diseases: Secondary | ICD-10-CM

## 2018-12-05 ENCOUNTER — Other Ambulatory Visit: Payer: Self-pay | Admitting: *Deleted

## 2018-12-05 DIAGNOSIS — E538 Deficiency of other specified B group vitamins: Secondary | ICD-10-CM

## 2018-12-05 DIAGNOSIS — K120 Recurrent oral aphthae: Secondary | ICD-10-CM

## 2018-12-31 ENCOUNTER — Ambulatory Visit (INDEPENDENT_AMBULATORY_CARE_PROVIDER_SITE_OTHER): Payer: 59

## 2018-12-31 ENCOUNTER — Other Ambulatory Visit: Payer: Self-pay

## 2018-12-31 DIAGNOSIS — Z23 Encounter for immunization: Secondary | ICD-10-CM

## 2019-01-06 ENCOUNTER — Encounter: Payer: Self-pay | Admitting: Internal Medicine

## 2019-04-08 ENCOUNTER — Encounter: Payer: Self-pay | Admitting: Family Medicine

## 2019-04-08 ENCOUNTER — Other Ambulatory Visit: Payer: Self-pay | Admitting: Family Medicine

## 2019-04-08 DIAGNOSIS — N926 Irregular menstruation, unspecified: Secondary | ICD-10-CM

## 2019-04-08 NOTE — Telephone Encounter (Signed)
Please call pt and schedule appt. Thanks

## 2019-04-08 NOTE — Telephone Encounter (Signed)
Patient has been scheduled

## 2019-04-09 NOTE — Telephone Encounter (Signed)
Last OV 11/27/18 Next OV 05/07/19 with Dr. Rogers Blocker to establish care   Rx refilled x 90 days

## 2019-05-07 ENCOUNTER — Ambulatory Visit (INDEPENDENT_AMBULATORY_CARE_PROVIDER_SITE_OTHER): Payer: 59 | Admitting: Family Medicine

## 2019-05-07 ENCOUNTER — Encounter: Payer: Self-pay | Admitting: Family Medicine

## 2019-05-07 VITALS — Ht 62.0 in | Wt 110.0 lb

## 2019-05-07 DIAGNOSIS — Z3041 Encounter for surveillance of contraceptive pills: Secondary | ICD-10-CM

## 2019-05-07 DIAGNOSIS — E538 Deficiency of other specified B group vitamins: Secondary | ICD-10-CM | POA: Diagnosis not present

## 2019-05-07 DIAGNOSIS — Z0000 Encounter for general adult medical examination without abnormal findings: Secondary | ICD-10-CM

## 2019-05-07 MED ORDER — TRIAMCINOLONE ACETONIDE 0.1 % MT PSTE: 1.0000 "application " | PASTE | Freq: Two times a day (BID) | OROMUCOSAL | 2 refills | Status: DC

## 2019-05-07 NOTE — Progress Notes (Signed)
Patient: Katie Estrada MRN: [id number] DOB: 06-Oct-2000 PCP: Briscoe Deutscher, DO     I connected with Kennieth Rad on 05/07/19 at 1:48pm by a video enabled telemedicine application and verified that I am speaking with the correct person using two identifiers.  Location patient: Home Location provider: [facility], Office Persons participating in this virtual visit: Alexiyah Stern and Dr. Rogers Blocker  I discussed the limitations of evaluation and management by telemedicine and the availability of in person appointments. The patient expressed understanding and agreed to proceed.   Subjective:  Chief Complaint  Patient presents with  . Follow-up  . refills on triamcinolone and birth control  . b12 injection    HPI: The patient is a 18 y.o. female who presents today for transfer of care and refill of medication.   1) OCP: has been on for a few years. She was put on this for regulation of periods. She takes daily as prescribed and sugar pills monthly. She is not sexually active. She has no complaints. She is not due for a refill until 06/2019. Has not had any exam done.  2) triamcinolone paste: gets recurrent aphthous ulcers, works well for her.   3) b12: has b12 deficiency despite IM replacement. Due for recheck on injections q 2 weeks. Never been worked up for pernicious anemia.   Review of Systems  Constitutional: Negative for chills, fatigue and fever.  HENT: Negative for dental problem, ear pain, hearing loss and trouble swallowing.   Eyes: Negative for visual disturbance.  Respiratory: Negative for cough, chest tightness and shortness of breath.   Cardiovascular: Negative for chest pain, palpitations and leg swelling.  Gastrointestinal: Negative for abdominal pain, blood in stool, diarrhea and nausea.  Endocrine: Negative for cold intolerance, polydipsia, polyphagia and polyuria.  Genitourinary: Negative for dysuria and hematuria.  Musculoskeletal: Negative for arthralgias.  Skin:  Negative for rash.  Neurological: Negative for dizziness and headaches.  Psychiatric/Behavioral: Negative for dysphoric mood and sleep disturbance. The patient is not nervous/anxious.     Allergies Patient has No Known Allergies.  Past Medical History Patient  has a past medical history of Heart murmur.  Surgical History Patient  has no past surgical history on file.  Family History Pateint's family history is not on file.  Social History Patient  reports that she has never smoked. She has never used smokeless tobacco. She reports that she does not drink alcohol or use drugs.    Objective: Vitals:   05/07/19 1314  Weight: 110 lb (49.9 kg)  Height: 5\' 2"  (1.575 m)    Body mass index is 20.12 kg/m.  Physical Exam Vitals signs reviewed.  Constitutional:      Appearance: Normal appearance.  HENT:     Head: Normocephalic and atraumatic.  Pulmonary:     Effort: Pulmonary effort is normal.  Neurological:     General: No focal deficit present.     Mental Status: She is alert and oriented to person, place, and time.        Assessment/plan: 1. Annual physical exam Checking labs and will need to come in for exam.  - CBC with Differential/Platelet; Future - Comprehensive metabolic panel; Future - Lipid panel; Future - TSH; Future - VITAMIN D 25 Hydroxy (Vit-D Deficiency, Fractures); Future  2. Vitamin B12 deficiency Hx of unknown cause of b12 deficiency. Checking mma/folate/homocysteine and IF to r/o pernicious anemia other causes.   - CBC with Differential/Platelet; Future - Vitamin B12; Future - Intrinsic Factor Antibodies; Future - Methylmalonic acid(mma),  rnd urine; Future - Folate RBC; Future  3. Encounter for surveillance of contraceptive pills Taking as directed. Does not need refill at this time. Will need physical exam within next year. Pap once 21 years if sexually active.    Return if symptoms worsen or fail to improve.  Records requested if needed.  Time spent with patient: 15 minutes, of which >50% was spent in obtaining information about her symptoms, reviweing her previous labs, evaluations, and treatments, counseling her about her conditions (please see discussed topics above), and developing a plan to further investigate it; she had a number of questions which I addressed.    Orma Flaming, MD [place]  05/07/2019

## 2019-05-13 ENCOUNTER — Encounter: Payer: Self-pay | Admitting: Family Medicine

## 2019-05-14 ENCOUNTER — Other Ambulatory Visit: Payer: Self-pay | Admitting: Family Medicine

## 2019-05-14 ENCOUNTER — Encounter: Payer: Self-pay | Admitting: Family Medicine

## 2019-06-03 ENCOUNTER — Other Ambulatory Visit: Payer: 59

## 2019-06-03 ENCOUNTER — Ambulatory Visit: Payer: 59

## 2019-06-05 ENCOUNTER — Other Ambulatory Visit: Payer: Self-pay

## 2019-06-05 ENCOUNTER — Other Ambulatory Visit (INDEPENDENT_AMBULATORY_CARE_PROVIDER_SITE_OTHER): Payer: No Typology Code available for payment source

## 2019-06-05 ENCOUNTER — Other Ambulatory Visit: Payer: No Typology Code available for payment source

## 2019-06-05 ENCOUNTER — Telehealth: Payer: Self-pay

## 2019-06-05 ENCOUNTER — Ambulatory Visit: Payer: No Typology Code available for payment source

## 2019-06-05 DIAGNOSIS — Z1159 Encounter for screening for other viral diseases: Secondary | ICD-10-CM

## 2019-06-05 DIAGNOSIS — Z0000 Encounter for general adult medical examination without abnormal findings: Secondary | ICD-10-CM

## 2019-06-05 DIAGNOSIS — E538 Deficiency of other specified B group vitamins: Secondary | ICD-10-CM | POA: Diagnosis not present

## 2019-06-05 LAB — COMPREHENSIVE METABOLIC PANEL
ALT: 9 U/L (ref 0–35)
AST: 14 U/L (ref 0–37)
Albumin: 4.1 g/dL (ref 3.5–5.2)
Alkaline Phosphatase: 50 U/L (ref 39–117)
BUN: 10 mg/dL (ref 6–23)
CO2: 28 mEq/L (ref 19–32)
Calcium: 9.2 mg/dL (ref 8.4–10.5)
Chloride: 104 mEq/L (ref 96–112)
Creatinine, Ser: 0.97 mg/dL (ref 0.40–1.20)
GFR: 72.87 mL/min (ref >60.00)
Glucose, Bld: 130 mg/dL — ABNORMAL HIGH (ref 70–99)
Potassium: 4 mEq/L (ref 3.5–5.1)
Sodium: 138 mEq/L (ref 135–145)
Total Bilirubin: 0.5 mg/dL (ref 0.2–1.2)
Total Protein: 6.6 g/dL (ref 6.0–8.3)

## 2019-06-05 LAB — LIPID PANEL
Cholesterol: 218 mg/dL — ABNORMAL HIGH (ref 0–200)
HDL: 48.6 mg/dL (ref >39.00)
LDL Cholesterol: 144 mg/dL — ABNORMAL HIGH (ref 0–99)
NonHDL: 169.59
Total CHOL/HDL Ratio: 4
Triglycerides: 130 mg/dL (ref 0.0–149.0)
VLDL: 26 mg/dL (ref 0.0–40.0)

## 2019-06-05 LAB — CBC WITH DIFFERENTIAL/PLATELET
Basophils Absolute: 0.1 10*3/uL (ref 0.0–0.1)
Basophils Relative: 0.9 % (ref 0.0–3.0)
Eosinophils Absolute: 0 10*3/uL (ref 0.0–0.7)
Eosinophils Relative: 0.7 % (ref 0.0–5.0)
HCT: 35.8 % — ABNORMAL LOW (ref 36.0–46.0)
Hemoglobin: 11.8 g/dL — ABNORMAL LOW (ref 12.0–15.0)
Lymphocytes Relative: 22.5 % (ref 12.0–46.0)
Lymphs Abs: 1.6 10*3/uL (ref 0.7–4.0)
MCHC: 32.9 g/dL (ref 30.0–36.0)
MCV: 80.6 fl (ref 78.0–100.0)
Monocytes Absolute: 0.3 10*3/uL (ref 0.1–1.0)
Monocytes Relative: 3.9 % (ref 3.0–12.0)
Neutro Abs: 5.2 10*3/uL (ref 1.4–7.7)
Neutrophils Relative %: 72 % (ref 43.0–77.0)
Platelets: 354 10*3/uL (ref 150.0–400.0)
RBC: 4.43 Mil/uL (ref 3.87–5.11)
RDW: 12.5 % (ref 11.5–14.6)
WBC: 7.3 10*3/uL (ref 4.5–10.5)

## 2019-06-05 LAB — TSH: TSH: 1.09 u[IU]/mL (ref 0.35–5.50)

## 2019-06-05 LAB — VITAMIN B12: Vitamin B-12: 324 pg/mL (ref 211–911)

## 2019-06-05 LAB — VITAMIN D 25 HYDROXY (VIT D DEFICIENCY, FRACTURES): VITD: 21.02 ng/mL — ABNORMAL LOW (ref 30.00–100.00)

## 2019-06-05 NOTE — Telephone Encounter (Signed)
Patient was scheduled for Hepatitis B vaccine today; had one on 12/04/18 and 12/31/18.  Had all Hep B immunizations as a child.  Labs for titers were drawn in June of 2020 but wrong test was ordered (Hep B core ab) Advised patient that we would not do the injection today.  Patient was also here for lab work today; hepatitis b surface antibody titers were added on and based on those results, we will determine if she needs another Hep b vaccine.  Patient verbalized understanding.

## 2019-06-05 NOTE — Progress Notes (Signed)
hep

## 2019-06-05 NOTE — Addendum Note (Signed)
Addended by: Francis Dowse T on: 06/05/2019 02:51 PM   Modules accepted: Orders

## 2019-06-05 NOTE — Addendum Note (Signed)
Addended by: Francis Dowse T on: 06/05/2019 02:24 PM   Modules accepted: Orders

## 2019-06-06 ENCOUNTER — Other Ambulatory Visit (INDEPENDENT_AMBULATORY_CARE_PROVIDER_SITE_OTHER): Payer: 59

## 2019-06-06 DIAGNOSIS — E538 Deficiency of other specified B group vitamins: Secondary | ICD-10-CM | POA: Diagnosis not present

## 2019-06-06 LAB — HEPATITIS B SURFACE ANTIBODY, QUANTITATIVE: Hep B S AB Quant (Post): 934 m[IU]/mL (ref >=10)

## 2019-06-06 NOTE — Addendum Note (Signed)
Addended by: Francis Dowse T on: 06/06/2019 04:12 PM   Modules accepted: Orders

## 2019-06-06 NOTE — Addendum Note (Signed)
Addended by: Francis Dowse T on: 06/06/2019 04:11 PM   Modules accepted: Orders

## 2019-06-09 ENCOUNTER — Other Ambulatory Visit: Payer: Self-pay | Admitting: Family Medicine

## 2019-06-09 ENCOUNTER — Encounter: Payer: Self-pay | Admitting: Family Medicine

## 2019-06-09 MED ORDER — "SYRINGE 25G X 1"" 3 ML MISC": 1.0000 "application " | 0 refills | Status: DC

## 2019-06-09 MED ORDER — VITAMIN D (ERGOCALCIFEROL) 1.25 MG (50000 UNIT) PO CAPS: ORAL_CAPSULE | ORAL | 0 refills | Status: DC

## 2019-06-09 MED ORDER — CYANOCOBALAMIN 1000 MCG/ML IJ SOLN: INTRAMUSCULAR | 0 refills | Status: DC

## 2019-06-09 NOTE — Addendum Note (Signed)
Addended by: Francis Dowse T on: 06/09/2019 04:23 PM   Modules accepted: Orders

## 2019-06-09 NOTE — Addendum Note (Signed)
Addended by: Francis Dowse T on: 06/09/2019 04:22 PM   Modules accepted: Orders

## 2019-06-10 ENCOUNTER — Ambulatory Visit: Payer: 59

## 2019-06-10 ENCOUNTER — Other Ambulatory Visit: Payer: 59

## 2019-06-10 NOTE — Addendum Note (Signed)
Addended by: Francis Dowse T on: 06/10/2019 04:36 PM   Modules accepted: Orders

## 2019-06-11 LAB — METHYLMALONIC ACID(MMA), RND URINE

## 2019-06-11 LAB — EXTRA URINE SPECIMEN

## 2019-06-12 LAB — FOLATE RBC

## 2019-06-12 LAB — TEST AUTHORIZATION

## 2019-06-12 LAB — METHYLMALONIC ACID, SERUM: Methylmalonic Acid, Quant: 113 nmol/L (ref 87–318)

## 2019-06-12 LAB — INTRINSIC FACTOR ANTIBODIES: Intrinsic Factor: NEGATIVE

## 2019-06-12 LAB — METHYLMALONIC ACID(MMA), RND URINE

## 2019-06-12 LAB — HOMOCYSTEINE: Homocysteine: 5.5 umol/L (ref <10.4)

## 2019-06-13 ENCOUNTER — Other Ambulatory Visit: Payer: No Typology Code available for payment source

## 2019-06-13 ENCOUNTER — Other Ambulatory Visit: Payer: Self-pay

## 2019-06-13 DIAGNOSIS — E538 Deficiency of other specified B group vitamins: Secondary | ICD-10-CM

## 2019-06-15 LAB — FOLATE RBC: RBC Folate: 854 ng/mL RBC (ref >280)

## 2019-07-03 ENCOUNTER — Other Ambulatory Visit: Payer: Self-pay | Admitting: Family Medicine

## 2019-07-09 ENCOUNTER — Other Ambulatory Visit: Payer: Self-pay

## 2019-07-09 DIAGNOSIS — N926 Irregular menstruation, unspecified: Secondary | ICD-10-CM

## 2019-07-09 MED ORDER — NORETHINDRONE ACET-ETHINYL EST 1-20 MG-MCG PO TABS: 1.0000 | ORAL_TABLET | Freq: Every day | ORAL | 1 refills | Status: DC

## 2019-07-21 ENCOUNTER — Other Ambulatory Visit: Payer: Self-pay

## 2019-07-21 ENCOUNTER — Encounter: Payer: Self-pay | Admitting: Family Medicine

## 2019-07-21 ENCOUNTER — Other Ambulatory Visit: Payer: Self-pay | Admitting: Family Medicine

## 2019-07-21 DIAGNOSIS — L700 Acne vulgaris: Secondary | ICD-10-CM

## 2019-07-21 MED ORDER — ADAPALENE-BENZOYL PEROXIDE 0.1-2.5 % EX GEL: 1.0000 "application " | Freq: Every day | CUTANEOUS | 1 refills | Status: AC

## 2019-07-21 NOTE — Progress Notes (Unsigned)
Placed referral for Dermatology. Please Advise.

## 2019-07-21 NOTE — Telephone Encounter (Signed)
Please Advise

## 2019-07-28 ENCOUNTER — Other Ambulatory Visit: Payer: Self-pay | Admitting: Family Medicine

## 2019-07-28 ENCOUNTER — Other Ambulatory Visit: Payer: Self-pay

## 2019-07-28 NOTE — Progress Notes (Signed)
Sent to pharmacy 

## 2019-08-01 ENCOUNTER — Other Ambulatory Visit: Payer: Self-pay | Admitting: Family Medicine

## 2019-08-01 ENCOUNTER — Encounter: Payer: Self-pay | Admitting: Family Medicine

## 2019-08-01 ENCOUNTER — Telehealth: Payer: Self-pay

## 2019-08-01 DIAGNOSIS — Z111 Encounter for screening for respiratory tuberculosis: Secondary | ICD-10-CM

## 2019-08-01 NOTE — Telephone Encounter (Signed)
Updated.

## 2019-08-01 NOTE — Telephone Encounter (Signed)
Patient received the Flu Vaccine 02/28/20 at [place].

## 2019-08-05 ENCOUNTER — Other Ambulatory Visit (INDEPENDENT_AMBULATORY_CARE_PROVIDER_SITE_OTHER): Payer: 59

## 2019-08-05 ENCOUNTER — Other Ambulatory Visit: Payer: Self-pay

## 2019-08-05 DIAGNOSIS — Z111 Encounter for screening for respiratory tuberculosis: Secondary | ICD-10-CM

## 2019-08-08 LAB — QUANTIFERON-TB GOLD PLUS
Mitogen-NIL: >10 IU/mL
NIL: 0.03 IU/mL
QuantiFERON-TB Gold Plus: NEGATIVE
TB1-NIL: 0 IU/mL
TB2-NIL: 0 IU/mL

## 2019-08-24 ENCOUNTER — Other Ambulatory Visit: Payer: Self-pay | Admitting: Family Medicine

## 2019-09-03 ENCOUNTER — Other Ambulatory Visit: Payer: Self-pay | Admitting: Family Medicine

## 2019-09-28 ENCOUNTER — Other Ambulatory Visit: Payer: Self-pay | Admitting: Family Medicine

## 2019-10-28 ENCOUNTER — Other Ambulatory Visit: Payer: Self-pay | Admitting: Family Medicine

## 2019-11-09 ENCOUNTER — Other Ambulatory Visit: Payer: Self-pay | Admitting: Physician Assistant

## 2019-11-09 ENCOUNTER — Encounter: Payer: Self-pay | Admitting: Family Medicine

## 2019-11-10 ENCOUNTER — Other Ambulatory Visit: Payer: Self-pay | Admitting: Physician Assistant

## 2019-11-10 MED ORDER — PREDNISONE 20 MG PO TABS: 40.0000 mg | ORAL_TABLET | Freq: Every day | ORAL | 0 refills | Status: DC

## 2019-12-25 ENCOUNTER — Other Ambulatory Visit: Payer: Self-pay | Admitting: Family Medicine

## 2019-12-25 DIAGNOSIS — N926 Irregular menstruation, unspecified: Secondary | ICD-10-CM

## 2020-03-05 ENCOUNTER — Other Ambulatory Visit: Payer: Self-pay | Admitting: Physician Assistant

## 2020-03-08 ENCOUNTER — Other Ambulatory Visit: Payer: 59

## 2020-03-10 ENCOUNTER — Encounter: Payer: Self-pay | Admitting: Family Medicine

## 2020-03-11 ENCOUNTER — Other Ambulatory Visit: Payer: Self-pay | Admitting: Family Medicine

## 2020-03-11 DIAGNOSIS — N926 Irregular menstruation, unspecified: Secondary | ICD-10-CM

## 2020-03-15 ENCOUNTER — Other Ambulatory Visit: Payer: Self-pay | Admitting: *Deleted

## 2020-03-15 DIAGNOSIS — N926 Irregular menstruation, unspecified: Secondary | ICD-10-CM

## 2020-03-15 MED ORDER — NORETHINDRONE ACET-ETHINYL EST 1-20 MG-MCG PO TABS: ORAL_TABLET | ORAL | 0 refills | Status: DC

## 2020-05-19 ENCOUNTER — Encounter: Payer: Self-pay | Admitting: Family Medicine

## 2020-05-19 ENCOUNTER — Ambulatory Visit (INDEPENDENT_AMBULATORY_CARE_PROVIDER_SITE_OTHER): Payer: No Typology Code available for payment source | Admitting: Family Medicine

## 2020-05-19 ENCOUNTER — Other Ambulatory Visit: Payer: Self-pay

## 2020-05-19 VITALS — BP 108/71 | HR 83 | Temp 98.4°F | Ht 62.0 in | Wt 113.2 lb

## 2020-05-19 DIAGNOSIS — E538 Deficiency of other specified B group vitamins: Secondary | ICD-10-CM

## 2020-05-19 DIAGNOSIS — Z1159 Encounter for screening for other viral diseases: Secondary | ICD-10-CM | POA: Diagnosis not present

## 2020-05-19 DIAGNOSIS — Z0000 Encounter for general adult medical examination without abnormal findings: Secondary | ICD-10-CM | POA: Diagnosis not present

## 2020-05-19 DIAGNOSIS — R1013 Epigastric pain: Secondary | ICD-10-CM | POA: Diagnosis not present

## 2020-05-19 MED ORDER — PANTOPRAZOLE SODIUM 20 MG PO TBEC: DELAYED_RELEASE_TABLET | ORAL | 0 refills | Status: DC

## 2020-05-19 NOTE — Progress Notes (Signed)
Patient: Katie Estrada MRN: [id number] DOB: 2000/06/28 PCP: Orma Flaming, MD     Subjective:  Chief Complaint  Patient presents with  . Annual Exam  . OTHER    Vitamin B12 Deficiency.    HPI: The patient is a 19 y.o. female who presents today for annual exam. She denies any changes to past medical history. There have been no recent hospitalizations. They are not  following a well balanced diet and exercise plan. Weight has been stable. Pt states that her Mom is concerned that she is pale in color due to Anemia. She started Iron supplement that seemed to help. It is on hold due to lab work today. She also has hx of B12 deficiency and stopped taking over thanksgiving break.   No first degree relative with colon or breast cancer.   She does have complaints of acid reflux and stomach aches. They both come after eating. She will have with typical foods (greasy/fatty/fried) and then other foods. She doesn't drink a lot of caffeine, not really stressed, no international travel. No NSAID use.  Pain is epigastric area. No nausea/vomiting. Correlated with food. Has tried famotidine but doesn't feel like it helped. She did take before her meals with not much success. She does have well water. She does use a britta filter at school. Has been tested for celiac antibodies in 2020: negative.   Immunization History  Administered Date(s) Administered  . HPV Quadrivalent 03/24/2013, 06/16/2013, 11/12/2013  . Hepatitis A 12/18/2005, 01/15/2008  . Hepatitis B 06/16/1999, 09/18/1999, 12/29/1999  . Hepatitis B, adult 12/04/2018, 12/31/2018  . IPV 02/25/1999, 06/16/1999, 06/20/2000, 11/18/2003  . Influenza Inj Mdck Quad Pf 02/01/2018  . Influenza Inj Mdck Quad With Preservative 03/22/2019  . Influenza,inj,Quad PF,6+ Mos 03/15/2014, 03/11/2015, 01/12/2017  . Influenza,inj,quad, With Preservative 03/11/2015  . Influenza-Unspecified 03/22/2019, 03/08/2020  . MMR 12/29/1999, 11/18/2003  . Meningococcal  Conjugate 03/24/2013, 08/09/2015  . Moderna Sars-Covid-2 Vaccination 06/14/2019, 07/12/2019  . Pneumococcal-Unspecified 02/25/1999, 06/16/1999, 08/29/1999, 01/05/2000  . Tdap 02/25/1999, 06/16/1999, 08/29/1999, 06/20/2000, 01/15/2009, 12/04/2018  . Varicella 12/29/1999   Colonoscopy: routine screening  Mammogram: routine screening  Pap smear: n/a. Not having sex.    Review of Systems  Constitutional: Negative for chills, fatigue and fever.  HENT: Negative for dental problem, ear pain, hearing loss and trouble swallowing.   Eyes: Negative for visual disturbance.  Respiratory: Negative for cough, chest tightness and shortness of breath.   Cardiovascular: Negative for chest pain, palpitations and leg swelling.  Gastrointestinal: Negative for abdominal pain, blood in stool, diarrhea and nausea.  Endocrine: Negative for cold intolerance, polydipsia, polyphagia and polyuria.  Genitourinary: Negative for dysuria, flank pain, hematuria and pelvic pain.  Musculoskeletal: Negative for arthralgias.  Skin: Negative for rash.  Neurological: Negative for dizziness and headaches.  Psychiatric/Behavioral: Negative for dysphoric mood and sleep disturbance. The patient is not nervous/anxious.     Allergies Patient has No Known Allergies.  Past Medical History Patient  has a past medical history of Heart murmur.  Surgical History Patient  has no past surgical history on file.  Family History Pateint's family history is not on file.  Social History Patient  reports that she has never smoked. She has never used smokeless tobacco. She reports that she does not drink alcohol and does not use drugs.    Objective: Vitals:   05/19/20 1033  BP: 108/71  Pulse: 83  Temp: 98.4 F (36.9 C)  TempSrc: Temporal  SpO2: 100%  Weight: 113 lb 3.2 oz (51.3 kg)  Height: _0  (1.575 m)    Body mass index is 20.7 kg/m.  Physical Exam Vitals reviewed.  Constitutional:      Appearance: Normal  appearance. She is well-developed, normal weight and well-nourished.  HENT:     Head: Normocephalic and atraumatic.     Right Ear: Tympanic membrane, ear canal and external ear normal.     Left Ear: Tympanic membrane, ear canal and external ear normal.     Nose: Nose normal.     Mouth/Throat:     Mouth: Oropharynx is clear and moist. Mucous membranes are moist.  Eyes:     Extraocular Movements: Extraocular movements intact and EOM normal.     Conjunctiva/sclera: Conjunctivae normal.     Pupils: Pupils are equal, round, and reactive to light.  Neck:     Thyroid: No thyromegaly.  Cardiovascular:     Rate and Rhythm: Normal rate and regular rhythm.     Pulses: Intact distal pulses.     Heart sounds: Normal heart sounds. No murmur heard.   Pulmonary:     Effort: Pulmonary effort is normal.     Breath sounds: Normal breath sounds.  Abdominal:     General: Abdomen is flat. Bowel sounds are normal. There is no distension.     Palpations: Abdomen is soft.     Tenderness: There is no abdominal tenderness.  Musculoskeletal:     Cervical back: Normal range of motion and neck supple.  Lymphadenopathy:     Cervical: No cervical adenopathy.  Skin:    General: Skin is warm and dry.     Capillary Refill: Capillary refill takes less than 2 seconds.     Findings: No rash.  Neurological:     General: No focal deficit present.     Mental Status: She is alert and oriented to person, place, and time.     Cranial Nerves: No cranial nerve deficit.     Coordination: Coordination normal.     Deep Tendon Reflexes: Reflexes normal.  Psychiatric:        Mood and Affect: Mood and affect and mood normal.        Behavior: Behavior normal.    [place] Office Visit from 05/19/2020 in [place]  PHQ-2 Total Score 0         Assessment/plan: 1. Annual physical exam Routine labs today. She did have a donought this am. Really encouraged her to exercise as she has not  been doing this at all. Hm reviewed. Pap smear when sexually active. Has had her flu and covid shots. F/u with me in an year or as needed.  Patient counseling _1    Nutrition: Stressed importance of moderation in sodium/caffeine intake, saturated fat and cholesterol, caloric balance, sufficient intake of fresh fruits, vegetables, fiber, calcium, iron, and 1 mg of folate supplement per day (for females capable of pregnancy).  _2    Stressed the importance of regular exercise.   _3    Substance Abuse: Discussed cessation/primary prevention of tobacco, alcohol, or other drug use; driving or other dangerous activities under the influence; availability of treatment for abuse.   _4    Injury prevention: Discussed safety belts, safety helmets, smoke detector, smoking near bedding or upholstery.   _5    Sexuality: Discussed sexually transmitted diseases, partner selection, use of condoms, avoidance of unintended pregnancy  and contraceptive alternatives.  _6    Dental health: Discussed importance of regular tooth brushing, flossing, and dental visits.  _7    Health maintenance and immunizations  reviewed. Please refer to Health maintenance section.    - Comprehensive metabolic panel; Future - Lipid panel; Future - TSH; Future - COMPLETE METABOLIC PANEL WITH GFR; Future - COMPLETE METABOLIC PANEL WITH GFR - TSH - Lipid panel - Comprehensive metabolic panel  2. Vitamin B12 deficiency  - Vitamin B12; Future - Vitamin B12  3. Encounter for hepatitis C screening test for low risk patient  - Hepatitis C antibody  4. Epigastric pain Gastritis vs. GERD. Failed H2 blocker. Trial of low dose PPI x 2-4 weeks and diet changes. checking h.pylori as well. If not seeing improvement will send to GI.  - H. pylori breath test - Lipase; Future - Lipase    This visit occurred during the SARS-CoV-2 public health emergency.  Safety protocols were in place, including screening questions prior to the visit,  additional usage of staff PPE, and extensive cleaning of exam room while observing appropriate contact time as indicated for disinfecting solutions.     Return in about 1 year (around 05/19/2021).     Orma Flaming, MD [place]  05/19/2020

## 2020-05-19 NOTE — Patient Instructions (Signed)
1) I sent in protonix for you to take for GERD/gastritis. 1-2 pills every AM for one month then can stop. Also avoid greasy/fatty/fried foods. caffiene and alcohol can also make it worse.  Let me know if not getting better!   -checking b12/iron/etc.   gastroesophageal reflux disease  Gastroesophageal Reflux Disease, Adult Gastroesophageal reflux (GER) happens when acid from the stomach flows up into the tube that connects the mouth and the stomach (esophagus). Normally, food travels down the esophagus and stays in the stomach to be digested. With GER, food and stomach acid sometimes move back up into the esophagus. You may have a disease called gastroesophageal reflux disease (GERD) if the reflux:  Happens often.  Causes frequent or very bad symptoms.  Causes problems such as damage to the esophagus. When this happens, the esophagus becomes sore and swollen (inflamed). Over time, GERD can make small holes (ulcers) in the lining of the esophagus. What are the causes? This condition is caused by a problem with the muscle between the esophagus and the stomach. When this muscle is weak or not normal, it does not close properly to keep food and acid from coming back up from the stomach. The muscle can be weak because of:  Tobacco use.  Pregnancy.  Having a certain type of hernia (hiatal hernia).  Alcohol use.  Certain foods and drinks, such as coffee, chocolate, onions, and peppermint. What increases the risk? You are more likely to develop this condition if you:  Are overweight.  Have a disease that affects your connective tissue.  Use NSAID medicines. What are the signs or symptoms? Symptoms of this condition include:  Heartburn.  Difficult or painful swallowing.  The feeling of having a lump in the throat.  A bitter taste in the mouth.  Bad breath.  Having a lot of saliva.  Having an upset or bloated stomach.  Belching.  Chest pain. Different conditions can cause  chest pain. Make sure you see your doctor if you have chest pain.  Shortness of breath or noisy breathing (wheezing).  Ongoing (chronic) cough or a cough at night.  Wearing away of the surface of teeth (tooth enamel).  Weight loss. How is this treated? Treatment will depend on how bad your symptoms are. Your doctor may suggest:  Changes to your diet.  Medicine.  Surgery. Follow these instructions at home: Eating and drinking   Follow a diet as told by your doctor. You may need to avoid foods and drinks such as: ? Coffee and tea (with or without caffeine). ? Drinks that contain alcohol. ? Energy drinks and sports drinks. ? Bubbly (carbonated) drinks or sodas. ? Chocolate and cocoa. ? Peppermint and mint flavorings. ? Garlic and onions. ? Horseradish. ? Spicy and acidic foods. These include peppers, chili powder, curry powder, vinegar, hot sauces, and BBQ sauce. ? Citrus fruit juices and citrus fruits, such as oranges, lemons, and limes. ? Tomato-based foods. These include red sauce, chili, salsa, and pizza with red sauce. ? Fried and fatty foods. These include donuts, french fries, potato chips, and high-fat dressings. ? High-fat meats. These include hot dogs, rib eye steak, sausage, ham, and bacon. ? High-fat dairy items, such as whole milk, butter, and cream cheese.  Eat small meals often. Avoid eating large meals.  Avoid drinking large amounts of liquid with your meals.  Avoid eating meals during the 2-3 hours before bedtime.  Avoid lying down right after you eat.  Do not exercise right after you eat.  Lifestyle   Do not use any products that contain nicotine or tobacco. These include cigarettes, e-cigarettes, and chewing tobacco. If you need help quitting, ask your doctor.  Try to lower your stress. If you need help doing this, ask your doctor.  If you are overweight, lose an amount of weight that is healthy for you. Ask your doctor about a safe weight loss  goal. General instructions  Pay attention to any changes in your symptoms.  Take over-the-counter and prescription medicines only as told by your doctor. Do not take aspirin, ibuprofen, or other NSAIDs unless your doctor says it is okay.  Wear loose clothes. Do not wear anything tight around your waist.  Raise (elevate) the head of your bed about 6 inches (15 cm).  Avoid bending over if this makes your symptoms worse.  Keep all follow-up visits as told by your doctor. This is important. Contact a doctor if:  You have new symptoms.  You lose weight and you do not know why.  You have trouble swallowing or it hurts to swallow.  You have wheezing or a cough that keeps happening.  Your symptoms do not get better with treatment.  You have a hoarse voice. Get help right away if:  You have pain in your arms, neck, jaw, teeth, or back.  You feel sweaty, dizzy, or light-headed.  You have chest pain or shortness of breath.  You throw up (vomit) and your throw-up looks like blood or coffee grounds.  You pass out (faint).  Your poop (stool) is bloody or black.  You cannot swallow, drink, or eat. Summary  If a person has gastroesophageal reflux disease (GERD), food and stomach acid move back up into the esophagus and cause symptoms or problems such as damage to the esophagus.  Treatment will depend on how bad your symptoms are.  Follow a diet as told by your doctor.  Take all medicines only as told by your doctor. This information is not intended to replace advice given to you by your health care provider. Make sure you discuss any questions you have with your health care provider. Document Revised: 11/21/2017 Document Reviewed: 11/21/2017 Elsevier Patient Education  [place].   Influenza (flu) vaccine  This is recommended every year. Tetanus, diphtheria, and pertussis (Tdap) vaccine  You may need a Td booster every 10 years. Varicella (chickenpox)  vaccine  You may need this vaccine if you have not already been vaccinated. Human papillomavirus (HPV) vaccine  If recommended by your health care provider, you may need three doses over 6 months. Measles, mumps, and rubella (MMR) vaccine  You may need at least one dose of MMR. You may also need a second dose. Meningococcal conjugate (MenACWY) vaccine  One dose is recommended if you are 70-44 years old and a Market researcher living in a residence hall, or if you have one of several medical conditions. You may also need additional booster doses. Pneumococcal conjugate (PCV13) vaccine  You may need this if you have certain conditions and were not previously vaccinated. Pneumococcal polysaccharide (PPSV23) vaccine  You may need one or two doses if you smoke cigarettes or if you have certain conditions. Hepatitis A vaccine  You may need this if you have certain conditions or if you travel or work in places where you may be exposed to hepatitis A. Hepatitis B vaccine  You may need this if you have certain conditions or if you travel or work in places where you may be  exposed to hepatitis B. Haemophilus influenzae type b (Hib) vaccine  You may need this if you have certain risk factors. You may receive vaccines as individual doses or as more than one vaccine together in one shot (combination vaccines). Talk with your health care provider about the risks and benefits of combination vaccines. What tests do I need? Blood tests  Lipid and cholesterol levels. These may be checked every 5 years starting at age 39.  Hepatitis C test.  Hepatitis B test. Screening  Pelvic exam and Pap test. This may be done every 3 years starting at age 74.  Sexually transmitted disease (STD) testing, if you are at risk.  BRCA-related cancer screening. This may be done if you have a family history of breast, ovarian, tubal, or peritoneal cancers. Other tests  Tuberculosis skin test.  Vision  and hearing tests.  Skin exam.  Breast exam. Follow these instructions at home: Eating and drinking   Eat a diet that includes fresh fruits and vegetables, whole grains, lean protein, and low-fat dairy products.  Drink enough fluid to keep your urine pale yellow.  Do not drink alcohol if: ? Your health care provider tells you not to drink. ? You are pregnant, may be pregnant, or are planning to become pregnant. ? You are under the legal drinking age. In the [place] the legal drinking age is 87.  If you drink alcohol: ? Limit how much you have to 0-1 drink a day. ? Be aware of how much alcohol is in your drink. In the [place] one drink equals one 12 oz bottle of beer (355 mL), one 5 oz glass of wine (148 mL), or one 1 oz glass of hard liquor (44 mL). Lifestyle  Take daily care of your teeth and gums.  Stay active. Exercise at least 30 minutes 5 or more days of the week.  Do not use any products that contain nicotine or tobacco, such as cigarettes, e-cigarettes, and chewing tobacco. If you need help quitting, ask your health care provider.  Do not use drugs.  If you are sexually active, practice safe sex. Use a condom or other form of birth control (contraception) in order to prevent pregnancy and STIs (sexually transmitted infections). If you plan to become pregnant, see your health care provider for a pre-conception visit.  Find healthy ways to cope with stress, such as: ? Meditation, yoga, or listening to music. ? Journaling. ? Talking to a trusted person. ? Spending time with friends and family. Safety  Always wear your seat belt while driving or riding in a vehicle.  Do not drive if you have been drinking alcohol. Do not ride with someone who has been drinking.  Do not drive when you are tired or distracted. Do not text while driving.  Wear a helmet and other protective equipment during sports activities.  If you have firearms in your house, make sure you follow all  gun safety procedures.  Seek help if you have been bullied, physically abused, or sexually abused.  Use the Internet responsibly to avoid dangers such as online bullying and online sex predators. What's next?  Go to your health care provider once a year for a well check visit.  Ask your health care provider how often you should have your eyes and teeth checked.  Stay up to date on all vaccines. This information is not intended to replace advice given to you by your health care provider. Make sure you discuss any questions you have with  your health care provider. Document Revised: 05/09/2018 Document Reviewed: 05/09/2018 Elsevier Patient Education  2020 [employer].

## 2020-05-20 LAB — LIPID PANEL
Cholesterol: 178 mg/dL (ref <200)
HDL: 46 mg/dL — ABNORMAL LOW (ref >=50)
LDL Cholesterol (Calc): 108 mg/dL (calc) — ABNORMAL HIGH
Non-HDL Cholesterol (Calc): 132 mg/dL (calc) — ABNORMAL HIGH (ref <130)
Total CHOL/HDL Ratio: 3.9 (calc) (ref <5.0)
Triglycerides: 128 mg/dL (ref <150)

## 2020-05-20 LAB — COMPLETE METABOLIC PANEL WITH GFR
AG Ratio: 1.6 (calc) (ref 1.0–2.5)
ALT: 5 U/L — ABNORMAL LOW (ref 6–29)
AST: 12 U/L (ref 10–30)
Albumin: 4 g/dL (ref 3.6–5.1)
Alkaline phosphatase (APISO): 46 U/L (ref 31–125)
BUN: 15 mg/dL (ref 7–25)
CO2: 22 mmol/L (ref 20–32)
Calcium: 8.7 mg/dL (ref 8.6–10.2)
Chloride: 107 mmol/L (ref 98–110)
Creat: 0.83 mg/dL (ref 0.50–1.10)
GFR, Est African American: 117 mL/min/{1.73_m2} (ref >=60)
GFR, Est Non African American: 101 mL/min/{1.73_m2} (ref >=60)
Globulin: 2.5 g/dL (calc) (ref 1.9–3.7)
Glucose, Bld: 74 mg/dL (ref 65–99)
Potassium: 3.8 mmol/L (ref 3.5–5.3)
Sodium: 139 mmol/L (ref 135–146)
Total Bilirubin: 0.3 mg/dL (ref 0.2–1.2)
Total Protein: 6.5 g/dL (ref 6.1–8.1)

## 2020-05-20 LAB — LIPASE: Lipase: 34 U/L (ref 7–60)

## 2020-05-20 LAB — HEPATITIS C ANTIBODY
Hepatitis C Ab: NONREACTIVE
SIGNAL TO CUT-OFF: 0.01 (ref <1.00)

## 2020-05-20 LAB — VITAMIN B12: Vitamin B-12: 441 pg/mL (ref 200–1100)

## 2020-05-20 LAB — H. PYLORI BREATH TEST: H. pylori Breath Test: NOT DETECTED

## 2020-05-20 LAB — TSH: TSH: 2.66 mIU/L

## 2020-05-20 LAB — EXTRA LAV TOP TUBE

## 2020-06-01 ENCOUNTER — Other Ambulatory Visit: Payer: Self-pay | Admitting: Family Medicine

## 2020-06-01 DIAGNOSIS — N926 Irregular menstruation, unspecified: Secondary | ICD-10-CM

## 2020-06-02 ENCOUNTER — Other Ambulatory Visit: Payer: No Typology Code available for payment source

## 2020-06-02 DIAGNOSIS — Z20822 Contact with and (suspected) exposure to covid-19: Secondary | ICD-10-CM

## 2020-06-04 LAB — SARS-COV-2, NAA 2 DAY TAT

## 2020-06-04 LAB — NOVEL CORONAVIRUS, NAA: SARS-CoV-2, NAA: NOT DETECTED

## 2020-06-08 ENCOUNTER — Encounter: Payer: Self-pay | Admitting: Family Medicine

## 2020-06-11 ENCOUNTER — Other Ambulatory Visit: Payer: Self-pay | Admitting: Family Medicine

## 2020-07-09 ENCOUNTER — Other Ambulatory Visit: Payer: Self-pay | Admitting: Family Medicine

## 2020-08-20 ENCOUNTER — Other Ambulatory Visit: Payer: Self-pay | Admitting: Family Medicine

## 2020-08-20 DIAGNOSIS — N926 Irregular menstruation, unspecified: Secondary | ICD-10-CM

## 2020-08-29 ENCOUNTER — Encounter: Payer: Self-pay | Admitting: Family Medicine

## 2020-08-30 ENCOUNTER — Other Ambulatory Visit: Payer: Self-pay

## 2020-08-30 DIAGNOSIS — Z111 Encounter for screening for respiratory tuberculosis: Secondary | ICD-10-CM

## 2020-08-31 ENCOUNTER — Other Ambulatory Visit: Payer: Self-pay

## 2020-08-31 ENCOUNTER — Other Ambulatory Visit: Payer: No Typology Code available for payment source

## 2020-08-31 ENCOUNTER — Other Ambulatory Visit (INDEPENDENT_AMBULATORY_CARE_PROVIDER_SITE_OTHER): Payer: No Typology Code available for payment source

## 2020-08-31 DIAGNOSIS — Z111 Encounter for screening for respiratory tuberculosis: Secondary | ICD-10-CM | POA: Diagnosis not present

## 2020-09-02 ENCOUNTER — Encounter: Payer: Self-pay | Admitting: Family Medicine

## 2020-09-02 LAB — QUANTIFERON-TB GOLD PLUS
Mitogen-NIL: >10 IU/mL
NIL: 0.03 IU/mL
QuantiFERON-TB Gold Plus: NEGATIVE
TB1-NIL: 0 IU/mL
TB2-NIL: 0 IU/mL

## 2020-09-14 ENCOUNTER — Encounter: Payer: Self-pay | Admitting: Family Medicine

## 2020-10-01 ENCOUNTER — Encounter: Payer: Self-pay | Admitting: Family Medicine

## 2020-10-01 ENCOUNTER — Other Ambulatory Visit: Payer: Self-pay

## 2020-10-01 ENCOUNTER — Ambulatory Visit (INDEPENDENT_AMBULATORY_CARE_PROVIDER_SITE_OTHER): Payer: No Typology Code available for payment source | Admitting: Family Medicine

## 2020-10-01 ENCOUNTER — Other Ambulatory Visit (HOSPITAL_COMMUNITY)
Admission: RE | Admit: 2020-10-01 | Discharge: 2020-10-01 | Disposition: A | Discharge status: Home / Self Care | Payer: No Typology Code available for payment source | Source: Ambulatory Visit | Attending: Family Medicine | Admitting: Family Medicine

## 2020-10-01 VITALS — BP 106/70 | HR 80 | Temp 98.2°F | Ht 62.0 in | Wt 118.6 lb

## 2020-10-01 DIAGNOSIS — D229 Melanocytic nevi, unspecified: Secondary | ICD-10-CM | POA: Diagnosis not present

## 2020-10-01 NOTE — Progress Notes (Signed)
Patient: Katie Estrada MRN: [id number] DOB: 2000-09-10 PCP: Orma Flaming, MD     Subjective:  Chief Complaint  Patient presents with  . Mole Removal    HPI: The patient is a 20 y.o. female who presents today for mole removal on the right side of her abdomen. She has had this mole for as long as she can remember. It has not grown, doesn't bleed or changed in size or color. It does get caught on things. She would like it removed.   Review of Systems  Skin: Negative for rash and wound.       Mole on abdomen.     Allergies Patient has No Known Allergies.  Past Medical History Patient  has a past medical history of Heart murmur.  Surgical History Patient  has no past surgical history on file.  Family History Pateint's family history is not on file.  Social History Patient  reports that she has never smoked. She has never used smokeless tobacco. She reports that she does not drink alcohol and does not use drugs.    Objective: Vitals:   10/01/20 1055  BP: 106/70  Pulse: 80  Temp: 98.2 F (36.8 C)  TempSrc: Temporal  SpO2: 100%  Weight: 118 lb 9.6 oz (53.8 kg)  Height: 5\' 2"  (1.575 m)    Body mass index is 21.69 kg/m.  Physical Exam Vitals reviewed.  Constitutional:      Appearance: Normal appearance. She is normal weight.  HENT:     Head: Normocephalic and atraumatic.  Pulmonary:     Effort: Pulmonary effort is normal.  Skin:    Findings: Lesion present.     Comments: She has a large, dark brown slightly pedunculated lesion on right, lateral mid abdomen. One hair follicle coming out of it.   Neurological:     General: No focal deficit present.     Mental Status: She is alert and oriented to person, place, and time.    Procedure note Verbal consent obtained. Risks discussed including bleeding, scarring, pain, recurrence, infection. She wishes to proceed. Area cleaned with alcohol. 3 cc of lidocaine with epi used for local anesthetic. Shave procedure used  to remove lesion. Sent for pathology. Scant blood loss. Minimal drysol used for hemostasis. Bacitracin and band aid applied. Wound care given.     Assessment/plan: 1. Atypical mole Appears to regular nevus, but bothering her and catching on things. Sent for pathology and wound care given. Precautions given for infection and bleeding.  - Surgical pathology( [facility])      Return if symptoms worsen or fail to improve.   Orma Flaming, MD [place]   10/01/2020

## 2020-10-07 LAB — SURGICAL PATHOLOGY

## 2020-11-19 ENCOUNTER — Other Ambulatory Visit: Payer: Self-pay | Admitting: Family Medicine

## 2020-11-19 DIAGNOSIS — N926 Irregular menstruation, unspecified: Secondary | ICD-10-CM

## 2020-12-14 ENCOUNTER — Telehealth: Payer: Self-pay

## 2020-12-14 NOTE — Telephone Encounter (Signed)
Talked to pt's mom that Dr Rogers Blocker has left, she stated that she would call back to make toc appt

## 2021-01-25 ENCOUNTER — Encounter: Payer: Self-pay | Admitting: Family Medicine

## 2021-01-25 ENCOUNTER — Ambulatory Visit (INDEPENDENT_AMBULATORY_CARE_PROVIDER_SITE_OTHER): Payer: No Typology Code available for payment source | Admitting: Family Medicine

## 2021-01-25 ENCOUNTER — Other Ambulatory Visit: Payer: Self-pay

## 2021-01-25 VITALS — BP 102/62 | HR 116 | Temp 98.1°F | Ht 61.0 in | Wt 121.2 lb

## 2021-01-25 DIAGNOSIS — N926 Irregular menstruation, unspecified: Secondary | ICD-10-CM

## 2021-01-25 DIAGNOSIS — M542 Cervicalgia: Secondary | ICD-10-CM | POA: Diagnosis not present

## 2021-01-25 MED ORDER — NORETHINDRONE ACET-ETHINYL EST 1-20 MG-MCG PO TABS: ORAL_TABLET | ORAL | 3 refills | Status: DC

## 2021-01-25 NOTE — Progress Notes (Signed)
Musculoskeletal Exam  Patient: Katie Estrada DOB: Sep 27, 2000  DOS: 01/25/2021  SUBJECTIVE:  Chief Complaint:   Chief Complaint  Patient presents with   collar bone pain. right side    Pain started Monday 01/24/21    Katie Estrada is a 20 y.o.  female for evaluation and treatment of R collar bone pain.   Onset:  1 day ago. No inj or change in activity.  Location: R collar bone Character:  aching and sharp  Progression of issue:  has worsened Associated symptoms: swelling Denies redness, bruising, new topicals Treatment: to date has been ice and OTC NSAIDS.   Neurovascular symptoms: no  Past Medical History:  Diagnosis Date   Heart murmur     Objective: VITAL SIGNS: BP 102/62   Pulse (!) 116   Temp 98.1 F (36.7 C) (Oral)   Ht '5\' 1"'$  (1.549 m)   Wt 121 lb 4 oz (55 kg)   SpO2 99%   BMI 22.91 kg/m  Constitutional: Well formed, well developed. No acute distress. Thorax & Lungs: No accessory muscle use Musculoskeletal: R shoulder.   Normal active range of motion: yes.   Normal passive range of motion: yes Tenderness to palpation: yes, R trap and lateral R neck Deformity: no Ecchymosis: no Tests positive: none Tests negative: Spurling's, Neer's, cross over, Hawkins, Speed's, lift off Neurologic: Normal sensory function. No focal deficits noted. DTR's equal and symmetric in UE's. No clonus. Psychiatric: Normal mood. Age appropriate judgment and insight. Alert & oriented x 3.    Assessment:  Neck pain  Irregular menses - Will start OCPs.  - Plan: norethindrone-ethinyl estradiol (JUNEL 1/20) 1-20 MG-MCG tablet  Plan: Stretches/exercises, heat, ice, Tylenol, ibuprofen. PT if no better. Refill OCP.  F/u prn. The patient voiced understanding and agreement to the plan.   [place], DO 01/25/21  2:47 PM

## 2021-01-25 NOTE — Patient Instructions (Signed)
Heat (pad or rice pillow in microwave) over affected area, 10-15 minutes twice daily.   Ice/cold pack over area for 10-15 min twice daily.  OK to take Tylenol 1000 mg (2 extra strength tabs) or 975 mg (3 regular strength tabs) every 6 hours as needed.  Ibuprofen 400-600 mg (2-3 over the counter strength tabs) every 6 hours as needed for pain.  Let us know if you need anything.  Trapezius stretches/exercises Do exercises exactly as told by your health care provider and adjust them as directed. It is normal to feel mild stretching, pulling, tightness, or discomfort as you do these exercises, but you should stop right away if you feel sudden pain or your pain gets worse.   Stretching and range of motion exercises These exercises warm up your muscles and joints and improve the movement and flexibility of your shoulder. These exercises can also help to relieve pain, numbness, and tingling. If you are unable to do any of the following for any reason, do not further attempt to do it.   Exercise A: Flexion, standing     Stand and hold a broomstick, a cane, or a similar object. Place your hands a little more than shoulder-width apart on the object. Your left / right hand should be palm-up, and your other hand should be palm-down. Push the stick to raise your left / right arm out to your side and then over your head. Use your other hand to help move the stick. Stop when you feel a stretch in your shoulder, or when you reach the angle that is recommended by your health care provider. Avoid shrugging your shoulder while you raise your arm. Keep your shoulder blade tucked down toward your spine. Hold for 30 seconds. Slowly return to the starting position. Repeat 2 times. Complete this exercise 3 times per week.  Exercise B: Abduction, supine     Lie on your back and hold a broomstick, a cane, or a similar object. Place your hands a little more than shoulder-width apart on the object. Your left /  right hand should be palm-up, and your other hand should be palm-down. Push the stick to raise your left / right arm out to your side and then over your head. Use your other hand to help move the stick. Stop when you feel a stretch in your shoulder, or when you reach the angle that is recommended by your health care provider. Avoid shrugging your shoulder while you raise your arm. Keep your shoulder blade tucked down toward your spine. Hold for 30 seconds. Slowly return to the starting position. Repeat 2 times. Complete this exercise 3 times per week.  Exercise C: Flexion, active-assisted     Lie on your back. You may bend your knees for comfort. Hold a broomstick, a cane, or a similar object. Place your hands about shoulder-width apart on the object. Your palms should face toward your feet. Raise the stick and move your arms over your head and behind your head, toward the floor. Use your healthy arm to help your left / right arm move farther. Stop when you feel a gentle stretch in your shoulder, or when you reach the angle where your health care provider tells you to stop. Hold for 30 seconds. Slowly return to the starting position. Repeat 2 times. Complete this exercise 3 times per week.  Exercise D: External rotation and abduction     Stand in a door frame with one of your feet slightly in front  of the other. This is called a staggered stance. °Choose one of the following positions as told by your health care provider: °Place your hands and forearms on the door frame above your head. °Place your hands and forearms on the door frame at the height of your head. °Place your hands on the door frame at the height of your elbows. °Slowly move your weight onto your front foot until you feel a stretch across your chest and in the front of your shoulders. Keep your head and chest upright and keep your abdominal muscles tight. °Hold for 30 seconds. °To release the stretch, shift your weight to your back  foot. °Repeat 2 times. Complete this stretch 3 times per week. ° °Strengthening exercises °These exercises build strength and endurance in your shoulder. Endurance is the ability to use your muscles for a long time, even after your muscles get tired. °Exercise E: Scapular depression and adduction  °Sit on a stable chair. Support your arms in front of you with pillows, armrests, or a tabletop. Keep your elbows in line with the sides of your body. °Gently move your shoulder blades down toward your middle back. Relax the muscles on the tops of your shoulders and in the back of your neck. °Hold for 3 seconds. °Slowly release the tension and relax your muscles completely before doing this exercise again. Repeat for a total of 10 repetitions. °After you have practiced this exercise, try doing the exercise without the arm support. Then, try the exercise while standing instead of sitting. °Repeat 2 times. Complete this exercise 3 times per week. ° °Exercise F: Shoulder abduction, isometric  ° °  °Stand or sit about 4-6 inches (10-15 cm) from a wall with your left / right side facing the wall. °Bend your left / right elbow and gently press your elbow against the wall. °Increase the pressure slowly until you are pressing as hard as you can without shrugging your shoulder. °Hold for 3 seconds. °Slowly release the tension and relax your muscles completely. Repeat for a total of 10 repetitions. °Repeat 2 times. Complete this exercise 3 times per week. ° °Exercise G: Shoulder flexion, isometric  ° °  °Stand or sit about 4-6 inches (10-15 cm) away from a wall with your left / right side facing the wall. °Keep your left / right elbow straight and gently press the top of your fist against the wall. Increase the pressure slowly until you are pressing as hard as you can without shrugging your shoulder. °Hold for 10-15 seconds. °Slowly release the tension and relax your muscles completely. Repeat for a total of 10 repetitions. °Repeat  2 times. Complete this exercise 3 times per week. ° °Exercise H: Internal rotation  ° °  °Sit in a stable chair without armrests, or stand. Secure an exercise band at your left / right side, at elbow height. °Place a soft object, such as a folded towel or a small pillow, under your left / right upper arm so your elbow is a few inches (about 8 cm) away from your side. °Hold the end of the exercise band so the band stretches. °Keeping your elbow pressed against the soft object under your arm, move your forearm across your body toward your abdomen. Keep your body steady so the movement is only coming from your shoulder. °Hold for 3 seconds. °Slowly return to the starting position. Repeat for a total of 10 repetitions. °Repeat 2 times. Complete this exercise 3 times per week. ° °Exercise I:   External rotation  ° °  °Sit in a stable chair without armrests, or stand. °Secure an exercise band at your left / right side, at elbow height. °Place a soft object, such as a folded towel or a small pillow, under your left / right upper arm so your elbow is a few inches (about 8 cm) away from your side. °Hold the end of the exercise band so the band stretches. °Keeping your elbow pressed against the soft object under your arm, move your forearm out, away from your abdomen. Keep your body steady so the movement is only coming from your shoulder. °Hold for 3 seconds. °Slowly return to the starting position. Repeat for a total of 10 repetitions. °Repeat 2 times. Complete this exercise 3 times per week. °Exercise J: Shoulder extension  °Sit in a stable chair without armrests, or stand. Secure an exercise band to a stable object in front of you so the band is at shoulder height. °Hold one end of the exercise band in each hand. Your palms should face each other. °Straighten your elbows and lift your hands up to shoulder height. °Step back, away from the secured end of the exercise band, until the band stretches. °Squeeze your shoulder  blades together and pull your hands down to the sides of your thighs. Stop when your hands are straight down by your sides. Do not let your hands go behind your body. °Hold for 3 seconds. °Slowly return to the starting position. Repeat for a total of 10 repetitions. °Repeat 2 times. Complete this exercise 3 times per week. ° °Exercise K: Shoulder extension, prone  ° °  °Lie on your abdomen on a firm surface so your left / right arm hangs over the edge. °Hold a 5 lb weight in your hand so your palm faces in toward your body. Your arm should be straight. °Squeeze your shoulder blade down toward the middle of your back. °Slowly raise your arm behind you, up to the height of the surface that you are lying on. Keep your arm straight. °Hold for 3 seconds. °Slowly return to the starting position and relax your muscles. Repeat for a total of 10 repetitions. °Repeat 2 times. Complete this exercise 3 times per week.  ° °Exercise L: Horizontal abduction, prone  °Lie on your abdomen on a firm surface so your left / right arm hangs over the edge. °Hold a 5 lb weight in your hand so your palm faces toward your feet. Your arm should be straight. °Squeeze your shoulder blade down toward the middle of your back. °Bend your elbow so your hand moves up, until your elbow is bent to an "L" shape (90 degrees). With your elbow bent, slowly move your forearm forward and up. Raise your hand up to the height of the surface that you are lying on. °Your upper arm should not move, and your elbow should stay bent. °At the top of the movement, your palm should face the floor. °Hold for 3 seconds. °Slowly return to the starting position and relax your muscles. Repeat for a total of 10 repetitions. °Repeat 2 times. Complete this exercise 3 times per week. ° °Exercise M: Horizontal abduction, standing  °Sit on a stable chair, or stand. °Secure an exercise band to a stable object in front of you so the band is at shoulder height. °Hold one end of the  exercise band in each hand. °Straighten your elbows and lift your hands straight in front of you, up to shoulder height.   Your palms should face down, toward the floor. Step back, away from the secured end of the exercise band, until the band stretches. Move your arms out to your sides, and keep your arms straight. Hold for 3 seconds. Slowly return to the starting position. Repeat for a total of 10 repetitions. Repeat 2 times. Complete this exercise 3 times per week.  Exercise N: Scapular retraction and elevation  Sit on a stable chair, or stand. Secure an exercise band to a stable object in front of you so the band is at shoulder height. Hold one end of the exercise band in each hand. Your palms should face each other. Sit in a stable chair without armrests, or stand. Step back, away from the secured end of the exercise band, until the band stretches. Squeeze your shoulder blades together and lift your hands over your head. Keep your elbows straight. Hold for 3 seconds. Slowly return to the starting position. Repeat for a total of 10 repetitions. Repeat 2 times. Complete this exercise 3 times per week.  This information is not intended to replace advice given to you by your health care provider. Make sure you discuss any questions you have with your health care provider. Document Released: 05/15/2005 Document Revised: 01/20/2016 Document Reviewed: 04/01/2015 Elsevier Interactive Patient Education  2017 [employer].

## 2021-01-28 ENCOUNTER — Other Ambulatory Visit: Payer: Self-pay

## 2021-01-28 ENCOUNTER — Other Ambulatory Visit: Payer: Self-pay | Admitting: Family Medicine

## 2021-01-28 ENCOUNTER — Ambulatory Visit (HOSPITAL_BASED_OUTPATIENT_CLINIC_OR_DEPARTMENT_OTHER)
Admission: RE | Admit: 2021-01-28 | Discharge: 2021-01-28 | Disposition: A | Discharge status: Home / Self Care | Payer: No Typology Code available for payment source | Source: Ambulatory Visit | Attending: Family Medicine | Admitting: Family Medicine

## 2021-01-28 DIAGNOSIS — M898X1 Other specified disorders of bone, shoulder: Secondary | ICD-10-CM | POA: Diagnosis present

## 2021-01-28 NOTE — Progress Notes (Signed)
DG  °

## 2021-01-31 ENCOUNTER — Encounter: Payer: Self-pay | Admitting: Physician Assistant

## 2021-02-01 ENCOUNTER — Other Ambulatory Visit: Payer: Self-pay

## 2021-02-01 ENCOUNTER — Ambulatory Visit (INDEPENDENT_AMBULATORY_CARE_PROVIDER_SITE_OTHER): Payer: No Typology Code available for payment source | Admitting: Physician Assistant

## 2021-02-01 VITALS — BP 107/73 | HR 107 | Temp 98.1°F | Ht 61.0 in | Wt 121.0 lb

## 2021-02-01 DIAGNOSIS — M898X1 Other specified disorders of bone, shoulder: Secondary | ICD-10-CM | POA: Diagnosis not present

## 2021-02-01 DIAGNOSIS — M25511 Pain in right shoulder: Secondary | ICD-10-CM | POA: Diagnosis not present

## 2021-02-01 NOTE — Patient Instructions (Signed)
I think you would benefit from PT at this time. You may continue ice / heat to area and also try addition of Voltaren gel. Call sooner if worse or any changes. If still no improvement after PT, consider Sports Med referral.

## 2021-02-01 NOTE — Progress Notes (Signed)
Acute Office Visit  Subjective:    Patient ID: Katie Estrada, female    DOB: March 05, 2001, 20 y.o.   MRN: [id number]  Chief Complaint  Patient presents with   Shoulder Pain    Right arm x 1 week swollen     Shoulder Pain   Patient is in today for right clavicle pain x 1 week, NKI. Visit with Dr. Nani Ravens on 01/25/21 with instructions to try stretches, heat / ice, Tylenol and Ibuprofen. Pt messaged back that she was still not feeling better & XRAY was done on 01/28/21; it was negative.   Denies any numbness or tingling down her arm. No COVID sickness. No recent vaccines. No weakness in grip strength. No recent illness. No fevers or chills. No new exercise, doesn't work out. She is right handed. 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   Past Medical History:  Diagnosis Date   Heart murmur     No past surgical history on file.  No family history on file.  Social History   Socioeconomic History   Marital status: Single    Spouse name: Not on file   Number of children: Not on file   Years of education: Not on file   Highest education level: Not on file  Occupational History   Not on file  Tobacco Use   Smoking status: Never   Smokeless tobacco: Never  Vaping Use   Vaping Use: Never used  Substance and Sexual Activity   Alcohol use: No   Drug use: No   Sexual activity: Not Currently  Other Topics Concern   Not on file  Social History Narrative   Not on file   Social Determinants of Health   Financial Resource Strain: Not on file  Food Insecurity: Not on file  Transportation Needs: Not on file  Physical Activity: Not on file  Stress: Not on file  Social Connections: Not on file  Intimate Partner Violence: Not on file    Outpatient Medications Prior to Visit  Medication Sig Dispense Refill   Adapalene-Benzoyl Peroxide 0.1-2.5 % gel Apply 1 application topically at bedtime. 45 g 1   norethindrone-ethinyl estradiol (JUNEL 1/20) 1-20 MG-MCG tablet  TAKE 1 TABLET BY MOUTH DAILY. 84 tablet 3   pantoprazole (PROTONIX) 20 MG tablet TAKE 1-2 TABLETS EVERY MORNING FOR GASTRITIS 180 tablet 1   triamcinolone (KENALOG) 0.1 % paste USE AS DIRECTED 1 APPLICATION IN THE MOUTH OR THROAT 2 (TWO) TIMES DAILY. 5 g 12   No facility-administered medications prior to visit.    No Known Allergies  Review of Systems REFER TO HPI FOR PERTINENT POSITIVES AND NEGATIVES     Objective:    Physical Exam Vitals and nursing note reviewed.  Constitutional:      Appearance: Normal appearance.  Musculoskeletal:     Right shoulder: Tenderness (along clavicle, deltoid, rhomboids) present. No swelling, deformity or effusion. Normal range of motion. Normal strength. Normal pulse.     Comments: Some complaints of pain with flexion, internal and external rotation, but no decreased ROM. N/V intact.   Neurological:     Mental Status: She is alert.    BP 107/73   Pulse (!) 107   Temp 98.1 F (36.7 C)   Ht '5\' 1"'$  (1.549 m)   Wt 121 lb (54.9 kg)   LMP 01/05/2021   SpO2 99%   BMI 22.86 kg/m  Wt Readings from Last 3 Encounters:  02/01/21 121 lb (54.9 kg)  01/25/21 121 lb 4  oz (55 kg)  10/01/20 118 lb 9.6 oz (53.8 kg)    Health Maintenance Due  Topic Date Due   PAP-Cervical Cytology Screening  Never done   PAP SMEAR-Modifier  Never done   INFLUENZA VACCINE  12/27/2020    There are no preventive care reminders to display for this patient.   Lab Results  Component Value Date   TSH 2.66 05/19/2020   Lab Results  Component Value Date   WBC 7.3 06/05/2019   HGB 11.8 (L) 06/05/2019   HCT 35.8 (L) 06/05/2019   MCV 80.6 06/05/2019   PLT 354.0 06/05/2019   Lab Results  Component Value Date   NA 139 05/19/2020   K 3.8 05/19/2020   CO2 22 05/19/2020   GLUCOSE 74 05/19/2020   BUN 15 05/19/2020   CREATININE 0.83 05/19/2020   BILITOT 0.3 05/19/2020   ALKPHOS 50 06/05/2019   AST 12 05/19/2020   ALT 5 (L) 05/19/2020   PROT 6.5 05/19/2020    ALBUMIN 4.1 06/05/2019   CALCIUM 8.7 05/19/2020   GFR 72.87 06/05/2019   Lab Results  Component Value Date   CHOL 178 05/19/2020   Lab Results  Component Value Date   HDL 46 (L) 05/19/2020   Lab Results  Component Value Date   LDLCALC 108 (H) 05/19/2020   Lab Results  Component Value Date   TRIG 128 05/19/2020   Lab Results  Component Value Date   CHOLHDL 3.9 05/19/2020   No results found for: HGBA1C     Assessment & Plan:   Problem List Items Addressed This Visit   None   1. Clavicle pain 2. Acute pain of right shoulder She is tender along the clavicle, but also along surrounding muscles of the R shoulder. I think this is more of an MSK inflammation issue. Her XR was negative. She will benefit from PT at this time and continued use of conservative tx's such as Tylenol, Ibuprofen. May also try Voltaren gel. Reassured there were no lymph node enlargements that I noted. Rechek prn.    Katie Estrada M Jairy Angulo, PA-C

## 2021-05-03 ENCOUNTER — Encounter: Payer: No Typology Code available for payment source | Admitting: Physician Assistant

## 2021-05-19 ENCOUNTER — Encounter: Payer: Self-pay | Admitting: Physician Assistant

## 2021-05-19 ENCOUNTER — Ambulatory Visit (INDEPENDENT_AMBULATORY_CARE_PROVIDER_SITE_OTHER): Payer: No Typology Code available for payment source | Admitting: Physician Assistant

## 2021-05-19 VITALS — BP 116/77 | HR 95 | Temp 97.6°F | Ht 61.0 in | Wt 122.4 lb

## 2021-05-19 DIAGNOSIS — S8012XA Contusion of left lower leg, initial encounter: Secondary | ICD-10-CM | POA: Diagnosis not present

## 2021-05-19 DIAGNOSIS — D229 Melanocytic nevi, unspecified: Secondary | ICD-10-CM

## 2021-05-19 NOTE — Progress Notes (Signed)
° °  Subjective:    Patient ID: Katie Estrada, female    DOB: 08/24/2000, 20 y.o.   MRN: [id number]  Chief Complaint  Patient presents with   Bleeding/Bruising    Left thigh    Nevus    Right side of face    HPI Patient is in today for a few concerns.   Bruising left thigh - appears to be resolving, no pain. States last week she was at [place] with her family and was scratching the side of her leg and mom noticed the bruise. No SOB or CP. No swelling in the leg. Photo on her phone consistent with large bluish superficial bruise last week.  2.  Nevus right side of face came up in the last year, sometimes crusts over. No pain or itching. No hx of melanoma.   Past Medical History:  Diagnosis Date   Heart murmur     History reviewed. No pertinent surgical history.  History reviewed. No pertinent family history.  Social History   Tobacco Use   Smoking status: Never   Smokeless tobacco: Never  Vaping Use   Vaping Use: Never used  Substance Use Topics   Alcohol use: No   Drug use: No     No Known Allergies  Review of Systems NEGATIVE UNLESS OTHERWISE INDICATED IN HPI      Objective:     BP 116/77    Pulse 95    Temp 97.6 F (36.4 C)    Ht 5\' 1"  (1.549 m)    Wt 122 lb 6.1 oz (55.5 kg)    BMI 23.12 kg/m   Wt Readings from Last 3 Encounters:  05/19/21 122 lb 6.1 oz (55.5 kg)  02/01/21 121 lb (54.9 kg)  01/25/21 121 lb 4 oz (55 kg)    BP Readings from Last 3 Encounters:  05/19/21 116/77  02/01/21 107/73  01/25/21 102/62     Physical Exam Constitutional:      General: She is not in acute distress.    Appearance: Normal appearance. She is normal weight.  Skin:    Comments: See photo attached of nevus on R side of face.  Exam of left upper thigh shows barely visible yellowed area of ecchymosis.  Neurological:     Mental Status: She is alert.         Assessment & Plan:   Problem List Items Addressed This Visit   None Visit Diagnoses     Atypical mole     -  Primary   Traumatic ecchymosis of left lower leg, initial encounter           1. Atypical mole -Recommend consult with dermatology. AVS provided with name for her to contact.  2. Traumatic ecchymosis of left lower leg, initial encounter -Reassured, resolved. No further work-up needed.     Rohen Kimes M Mateen Franssen, PA-C

## 2021-05-19 NOTE — Patient Instructions (Addendum)
Please contact [facility]: [phone] and try to schedule appointment. Let me know if you have any issues getting in.   Bruising was likely secondary to scratching / small superficial injury. Let me know if any recurrent bruising or bleeding occurs.

## 2021-06-20 ENCOUNTER — Ambulatory Visit (INDEPENDENT_AMBULATORY_CARE_PROVIDER_SITE_OTHER): Payer: No Typology Code available for payment source | Admitting: Physician Assistant

## 2021-06-20 ENCOUNTER — Other Ambulatory Visit: Payer: Self-pay

## 2021-06-20 ENCOUNTER — Encounter: Payer: Self-pay | Admitting: Physician Assistant

## 2021-06-20 VITALS — BP 100/60 | HR 68 | Temp 97.5°F | Ht 61.0 in | Wt 123.5 lb

## 2021-06-20 DIAGNOSIS — D229 Melanocytic nevi, unspecified: Secondary | ICD-10-CM | POA: Diagnosis not present

## 2021-06-20 DIAGNOSIS — E538 Deficiency of other specified B group vitamins: Secondary | ICD-10-CM

## 2021-06-20 LAB — VITAMIN B12: Vitamin B-12: 387 pg/mL (ref 211–911)

## 2021-06-20 NOTE — Patient Instructions (Signed)
It was great to see you!  Please have the dermatologist send [place] your records after you go.  We will update your B12 today.  Let's follow-up as needed.  Take care,  Inda Coke PA-C

## 2021-06-20 NOTE — Progress Notes (Signed)
Katie Estrada is a 21 y.o. female here for a follow up referred by former PCP, Dr. Rogers Blocker.   History of Present Illness:   Chief Complaint  Patient presents with   Transfer of care   Nevus    Pt c/o mole right temporal area on face next to right eye. Looks crusted at times. Has not changed in size or color.   B12 Injection    Pt would like it checked today.    HPI  Atypical Mole Katie Estrada has noticed a nevus on right temporal area next to right eye that she increasingly became concerned about. She has recently seen [name] about this issue on 05/19/21, and was referred to dermatology for further evaluation. Since this visit she has not noticed a change in size of nevus or concerning sx. She is set to visit dermatology next week.   Hx of B12 Deficiency Pt reports she was previously given injections by her mother with no complications. Prior to this, it was found that her recurrent canker sores were caused by this deficiency. She no longer receives B12 injections and is doing well at this time.    Past Medical History:  Diagnosis Date   Heart murmur      Social History   Tobacco Use   Smoking status: Never   Smokeless tobacco: Never  Vaping Use   Vaping Use: Never used  Substance Use Topics   Alcohol use: No   Drug use: No    History reviewed. No pertinent surgical history.  History reviewed. No pertinent family history.  No Known Allergies  Current Medications:   Current Outpatient Medications:    Adapalene-Benzoyl Peroxide 0.1-2.5 % gel, Apply 1 application topically at bedtime. (Patient taking differently: Apply 1 application topically as needed.), Disp: 45 g, Rfl: 1   norethindrone-ethinyl estradiol (JUNEL 1/20) 1-20 MG-MCG tablet, TAKE 1 TABLET BY MOUTH DAILY., Disp: 84 tablet, Rfl: 3   pantoprazole (PROTONIX) 20 MG tablet, TAKE 1-2 TABLETS EVERY MORNING FOR GASTRITIS (Patient taking differently: 20 mg as needed. TAKE 1-2 TABLETS EVERY MORNING FOR  GASTRITIS), Disp: 180 tablet, Rfl: 1   triamcinolone (KENALOG) 0.1 % paste, USE AS DIRECTED 1 APPLICATION IN THE MOUTH OR THROAT 2 (TWO) TIMES DAILY. (Patient taking differently: Use as directed 1 application in the mouth or throat 2 (two) times daily as needed.), Disp: 5 g, Rfl: 12   Review of Systems:   ROS Negative unless otherwise specified per HPI. Vitals:   Vitals:   06/20/21 1307  BP: 100/60  Pulse: 68  Temp: (!) 97.5 F (36.4 C)  TempSrc: Temporal  SpO2: 98%  Weight: 123 lb 8 oz (56 kg)  Height: 5\' 1"  (2.423 m)     Body mass index is 23.34 kg/m.  Physical Exam:   Physical Exam Vitals and nursing note reviewed.  Constitutional:      General: She is not in acute distress.    Appearance: She is well-developed. She is not ill-appearing or toxic-appearing.  Cardiovascular:     Rate and Rhythm: Normal rate and regular rhythm.     Pulses: Normal pulses.     Heart sounds: Normal heart sounds, S1 normal and S2 normal.  Pulmonary:     Effort: Pulmonary effort is normal.     Breath sounds: Normal breath sounds.  Skin:    General: Skin is warm and dry.     Comments: Approx 1/4 cm brown macule with irregular borders to R temporal area  Neurological:  Mental Status: She is alert.     GCS: GCS eye subscore is 4. GCS verbal subscore is 5. GCS motor subscore is 6.  Psychiatric:        Speech: Speech normal.        Behavior: Behavior normal. Behavior is cooperative.    Assessment and Plan:   Atypical mole No red flags on exam Follow up with dermatology for further evaluation   Vitamin B12 deficiency Update labs today, will make recommendations accordingly - Vitamin B12  I,Havlyn C Ratchford,acting as a scribe for Inda Coke, PA.,have documented all relevant documentation on the behalf of Inda Coke, PA,as directed by  Inda Coke, PA while in the presence of Inda Coke, [place].  I, Inda Coke, [place], have reviewed all documentation for this visit.  The documentation on 06/20/21 for the exam, diagnosis, procedures, and orders are all accurate and complete.   Inda Coke, PA-C

## 2021-06-21 ENCOUNTER — Encounter: Payer: Self-pay | Admitting: Physician Assistant

## 2021-06-21 MED ORDER — "BD LUER-LOK SYRINGE 25G X 1"" 3 ML MISC": 0 refills | Status: AC

## 2021-06-21 MED ORDER — CYANOCOBALAMIN 1000 MCG/ML IJ SOLN: 1000.0000 ug | INTRAMUSCULAR | 5 refills | Status: AC

## 2021-07-05 ENCOUNTER — Other Ambulatory Visit: Payer: Self-pay | Admitting: Physician Assistant

## 2021-08-25 ENCOUNTER — Encounter: Payer: Self-pay | Admitting: Physician Assistant

## 2021-08-26 ENCOUNTER — Other Ambulatory Visit: Payer: Self-pay | Admitting: Physician Assistant

## 2021-08-26 DIAGNOSIS — Z111 Encounter for screening for respiratory tuberculosis: Secondary | ICD-10-CM

## 2021-08-29 ENCOUNTER — Other Ambulatory Visit: Payer: No Typology Code available for payment source

## 2021-08-29 DIAGNOSIS — Z111 Encounter for screening for respiratory tuberculosis: Secondary | ICD-10-CM

## 2021-09-01 LAB — QUANTIFERON-TB GOLD PLUS
Mitogen-NIL: 8.95 IU/mL
NIL: 0.03 IU/mL
QuantiFERON-TB Gold Plus: NEGATIVE
TB1-NIL: <0 IU/mL
TB2-NIL: <0 IU/mL

## 2021-09-23 IMAGING — CR DG CLAVICLE*R*
2 series · 2 of 2 positions shown · non-contrast
Comparison: None.

CLINICAL DATA: Right clavicle swelling and pain without injury

EXAM:
RIGHT CLAVICLE - 2+ VIEWS

[w clavicle ap right *]
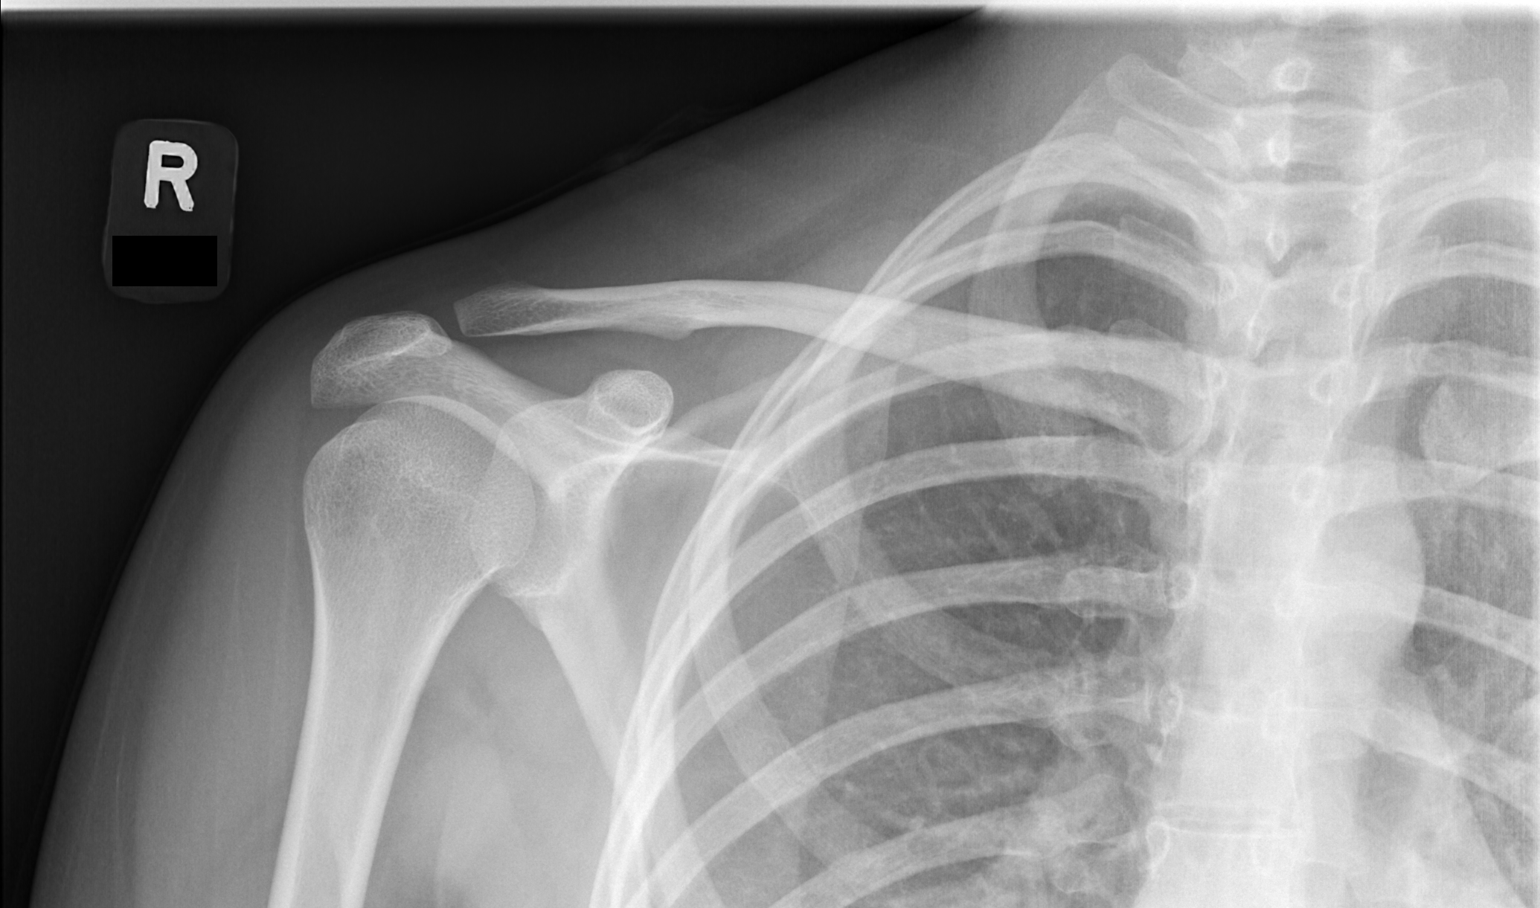

[w clavicle tangential right *]
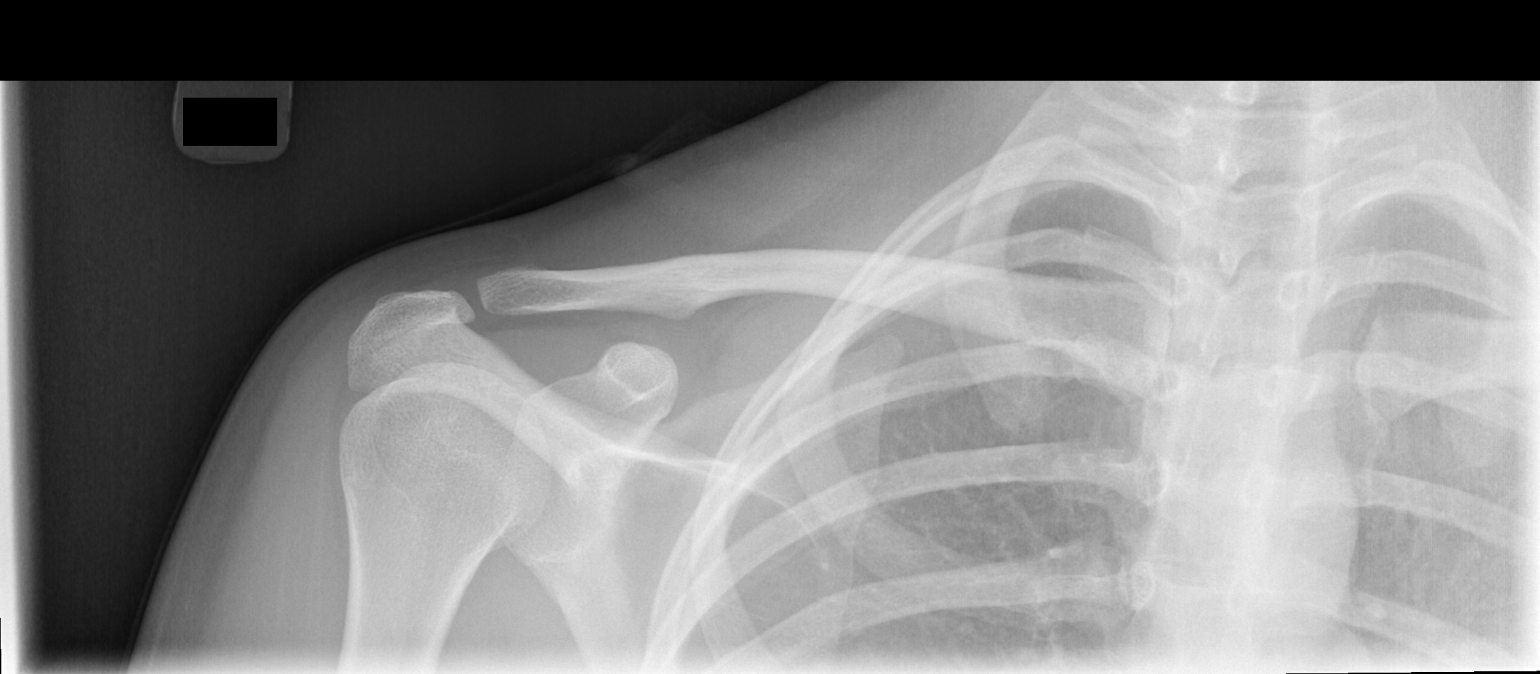

[2 of 2 positions shown; findings below may reference images not displayed]

FINDINGS: There is no evidence of fracture or other focal bone lesions. Soft
tissues and imaged lung are unremarkable.
IMPRESSION: Negative.

## 2022-01-02 ENCOUNTER — Other Ambulatory Visit: Payer: Self-pay | Admitting: Family Medicine

## 2022-01-02 DIAGNOSIS — N926 Irregular menstruation, unspecified: Secondary | ICD-10-CM

## 2022-02-01 ENCOUNTER — Other Ambulatory Visit: Payer: Self-pay | Admitting: Family Medicine

## 2022-02-01 DIAGNOSIS — N926 Irregular menstruation, unspecified: Secondary | ICD-10-CM

## 2022-02-20 ENCOUNTER — Encounter: Payer: Self-pay | Admitting: *Deleted

## 2022-03-25 ENCOUNTER — Encounter: Payer: Self-pay | Admitting: Physician Assistant

## 2022-03-27 ENCOUNTER — Other Ambulatory Visit: Payer: Self-pay | Admitting: Physician Assistant

## 2022-03-27 MED ORDER — CHLORHEXIDINE GLUCONATE 0.12 % MT SOLN: 15.0000 mL | Freq: Two times a day (BID) | OROMUCOSAL | 0 refills | Status: AC

## 2022-09-21 ENCOUNTER — Other Ambulatory Visit: Payer: Self-pay | Admitting: Physician Assistant

## 2022-10-13 ENCOUNTER — Ambulatory Visit (INDEPENDENT_AMBULATORY_CARE_PROVIDER_SITE_OTHER): Payer: No Typology Code available for payment source | Admitting: Plastic Surgery

## 2022-10-13 ENCOUNTER — Encounter: Payer: Self-pay | Admitting: Plastic Surgery

## 2022-10-13 VITALS — BP 106/70 | HR 81 | Ht 61.0 in | Wt 131.4 lb

## 2022-10-13 DIAGNOSIS — L988 Other specified disorders of the skin and subcutaneous tissue: Secondary | ICD-10-CM

## 2022-10-13 DIAGNOSIS — L819 Disorder of pigmentation, unspecified: Secondary | ICD-10-CM

## 2022-10-13 NOTE — Progress Notes (Signed)
Patient ID: Katie Estrada, female    DOB: [birth year], 22 y.o.   MRN: [id number]   Chief Complaint  Patient presents with   Skin Problem    The patient is a very sweet 22 year old female here with mom for evaluation of a changing skin lesion on her face.  She was seen by the dermatologist to suggested that she have it removed.  It is 4 mm in size and dark.  It is not raised.  It is located on the right temple area.  It is getting larger with time.  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  She is otherwise in very good health.    Review of Systems  Constitutional: Negative.   HENT: Negative.    Eyes: Negative.   Respiratory: Negative.    Cardiovascular: Negative.   Gastrointestinal: Negative.   Endocrine: Negative.   Genitourinary: Negative.   Musculoskeletal: Negative.     Past Medical History:  Diagnosis Date   Heart murmur     History reviewed. No pertinent surgical history.    Current Outpatient Medications:    Adapalene-Benzoyl Peroxide 0.1-2.5 % gel, Apply 1 application topically at bedtime. (Patient taking differently: Apply 1 application  topically as needed.), Disp: 45 g, Rfl: 1   chlorhexidine (PERIDEX) 0.12 % solution, Use as directed 15 mLs in the mouth or throat 2 (two) times daily., Disp: 120 mL, Rfl: 0   cyanocobalamin (,VITAMIN B-12,) 1000 MCG/ML injection, Inject 1 mL (1,000 mcg total) into the muscle every 30 (thirty) days., Disp: 1 mL, Rfl: 5   pantoprazole (PROTONIX) 20 MG tablet, TAKE 1-2 TABLETS EVERY MORNING FOR GASTRITIS (Patient taking differently: 20 mg as needed. TAKE 1-2 TABLETS EVERY MORNING FOR GASTRITIS), Disp: 180 tablet, Rfl: 1   SYRINGE-NEEDLE, DISP, 3 ML (B-D 3CC LUER-LOK SYR 25GX1") 25G X 1" 3 ML MISC, Use to inject Vit B12 into skin once a month., Disp: 6 each, Rfl: 0   triamcinolone (KENALOG) 0.1 % paste, USE AS DIRECTED 1 APPLICATION IN  THE MOUTH OR THROAT 2 (TWO) TIMES DAILY., Disp: 5 g, Rfl: 12   Objective:   Vitals:   10/13/22 0824  BP: 106/70  Pulse: 81  SpO2: 96%    Physical Exam Vitals and nursing note reviewed.  Constitutional:      Appearance: Normal appearance.  HENT:     Head: Normocephalic and atraumatic.   Cardiovascular:     Rate and Rhythm: Normal rate.     Pulses: Normal pulses.  Pulmonary:     Effort: Pulmonary effort is normal.  Abdominal:     Palpations: Abdomen is soft.  Musculoskeletal:        General: No swelling or deformity.  Skin:    General: Skin is warm.     Capillary Refill: Capillary refill takes less than 2 seconds.     Coloration: Skin is not jaundiced or pale.     Findings: No bruising or erythema.  Neurological:     Mental Status: She is alert and oriented to person, place, and time.  Psychiatric:        Mood and Affect: Mood normal.        Behavior: Behavior normal.        Thought Content: Thought content normal.        Judgment: Judgment normal.     Assessment & Plan:  Changing  pigmented skin lesion  Plan for excision of changing skin lesion right temple.  Patient is aware that she will have a scar.  It will fade in time but it will be a scar.  Pictures were obtained of the patient and placed in the chart with the patient's or guardian's permission.   Alena Bills Mele Sylvester, DO

## 2022-10-31 ENCOUNTER — Ambulatory Visit: Payer: No Typology Code available for payment source | Admitting: Plastic Surgery

## 2022-10-31 ENCOUNTER — Other Ambulatory Visit (HOSPITAL_COMMUNITY)
Admission: RE | Admit: 2022-10-31 | Discharge: 2022-10-31 | Disposition: A | Discharge status: Home / Self Care | Payer: No Typology Code available for payment source | Source: Ambulatory Visit | Attending: Plastic Surgery | Admitting: Plastic Surgery

## 2022-10-31 ENCOUNTER — Encounter: Payer: Self-pay | Admitting: Plastic Surgery

## 2022-10-31 VITALS — BP 116/76 | HR 78 | Ht 61.0 in | Wt 130.0 lb

## 2022-10-31 DIAGNOSIS — D2239 Melanocytic nevi of other parts of face: Secondary | ICD-10-CM

## 2022-10-31 DIAGNOSIS — L819 Disorder of pigmentation, unspecified: Secondary | ICD-10-CM

## 2022-10-31 DIAGNOSIS — Z719 Counseling, unspecified: Secondary | ICD-10-CM

## 2022-10-31 NOTE — Progress Notes (Signed)
Procedure Note  Preoperative Dx: changing skin lesion of right face  Postoperative Dx: Same  Procedure: excision of changing skin lesion of right face  Anesthesia: Lidocaine 1% with 1:100,000 epinephrine  Indication for Procedure: skin lesion  Description of Procedure: Risks and complications were explained to the patient and mom.  Consent was confirmed and the patient understands the risks and benefits.  The potential complications and alternatives were explained and the patient consents.  The patient expressed understanding the option of not having the procedure and the risks of a scar.  Time out was called and all information was confirmed to be correct.    The area was prepped and drapped.  Lidocaine 1% with epinephrine was injected in the subcutaneous area.  After waiting several minutes for the local to take affect a #15 blade was used to excise the area in an eliptical pattern. The skin edges were reapproximated with 5-0 Monocryl subcuticular running closure.  A dressing was applied.  The patient was given instructions on how to care for the area and a follow up appointment.  Katie Estrada tolerated the procedure well and there were no complications. The specimen was sent to pathology.

## 2022-11-02 LAB — SURGICAL PATHOLOGY

## 2022-11-03 ENCOUNTER — Telehealth: Payer: Self-pay | Admitting: *Deleted

## 2022-11-03 NOTE — Telephone Encounter (Signed)
Attempted to call pt. NA/LVM. Will try again on Monday

## 2022-11-03 NOTE — Telephone Encounter (Signed)
-----   Message from Peggye Form, DO sent at 11/03/2022  2:21 PM EDT ----- Please let the patient know that this was a dysplastic nevus.  Not cancer.  Complete excision would be helpful but can be done within the next year as she is able with her new job ----- Message ----- From: Interface, [facility] Sent: 11/02/2022   5:00 PM EDT To: Alena Bills Dillingham, DO

## 2022-11-06 ENCOUNTER — Ambulatory Visit: Payer: No Typology Code available for payment source | Admitting: Student

## 2022-11-06 NOTE — Telephone Encounter (Signed)
I spoke to Katie Estrada and reviewed results per Dr. Ulice Bold. Pt verbalized understanding  to contact the office if she wishes to pursue any other treatment or has further concerns or questions.

## 2022-11-08 ENCOUNTER — Encounter: Payer: Self-pay | Admitting: Student

## 2022-11-08 ENCOUNTER — Ambulatory Visit: Payer: No Typology Code available for payment source | Admitting: Student

## 2022-11-08 VITALS — BP 103/69 | HR 75 | Ht 61.0 in | Wt 130.0 lb

## 2022-11-08 DIAGNOSIS — Z9889 Other specified postprocedural states: Secondary | ICD-10-CM

## 2022-11-08 DIAGNOSIS — L819 Disorder of pigmentation, unspecified: Secondary | ICD-10-CM

## 2022-11-08 NOTE — Progress Notes (Signed)
Patient is a 22 year old female who underwent excision of changing skin lesion with Dr. Ulice Bold on 10/31/2022.  During the procedure, the lesion was removed and the skin edges were closed with 5-0 Monocryl dressings.  Specimen was sent to pathology.  Specimen showed dysplastic nevus with mild atypia.  Peripheral margin involved.  Patient presents to the clinic for postprocedural follow-up.  Per chart review, patient was contacted by our office with pathology results.  Per Dr. Ulice Bold, complete excision would be helpful, but can be done within the next year as patient is able.  Today, patient reports she is doing well.  She denies any issues with the surgical site.  She states Steri-Strips still in place.  She denies any drainage, fevers or chills.  I discussed the pathology with the patient.  She expressed understanding.  Patient states that she is moving to [place] on Sunday.  I recommended she establish care with dermatology when she is there and have the remainder of the nevus excised within the next year.  Patient expressed understanding.  On exam, patient is sitting upright in no acute distress.  Steri-Strip is in place over the right temple.  Steri-Strip was removed.  Incision appears to be intact with Monocryl suture.  There is no surrounding erythema or drainage noted on exam.  There are no signs of infection on exam.  Monocryl suture was cut and removed.  Patient tolerated well.  I recommended the patient apply Vaseline to her incision daily for the next week or so.  I then recommended she transition to silicone based scar creams.  Patient expressed understanding.  I discussed with the patient that she should avoid direct sunlight to the incision as this can worsen the scar.  I recommended she wear sunscreen and/or cover the area if she is can to be out in direct sunlight.  I also recommended she not submerge the incision for the next week or so.  Patient expressed understanding.  Patient  to follow-up as needed.  I instructed her to call us if she has any questions or concerns about anything.

## 2023-05-13 ENCOUNTER — Telehealth: Payer: No Typology Code available for payment source
